# Patient Record
Sex: Female | Born: 1990 | Race: Black or African American | Hispanic: No | Marital: Single | Smoking: Never smoker
Health system: Southern US, Community
[De-identification: ages and names within clinical notes are randomized; demographics above are authoritative.]

## PROBLEM LIST (undated history)

## (undated) ENCOUNTER — Inpatient Hospital Stay: Payer: Self-pay

## (undated) DIAGNOSIS — Z789 Other specified health status: Secondary | ICD-10-CM

## (undated) DIAGNOSIS — K219 Gastro-esophageal reflux disease without esophagitis: Secondary | ICD-10-CM

## (undated) HISTORY — PX: DILATION AND CURETTAGE OF UTERUS: SHX78

---
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
## 2012-07-16 ENCOUNTER — Emergency Department: Payer: Self-pay | Admitting: Emergency Medicine

## 2012-11-16 ENCOUNTER — Ambulatory Visit: Payer: Self-pay | Admitting: Family Medicine

## 2012-11-16 LAB — COMPREHENSIVE METABOLIC PANEL
Albumin: 4.1 g/dL (ref 3.8–5.6)
Alkaline Phosphatase: 57 U/L — ABNORMAL LOW (ref 82–169)
Anion Gap: 7 (ref 7–16)
BUN: 12 mg/dL (ref 7–18)
Bilirubin,Total: 0.2 mg/dL (ref 0.2–1.0)
Calcium, Total: 8.6 mg/dL — ABNORMAL LOW (ref 9.0–10.7)
Chloride: 104 mmol/L (ref 98–107)
Co2: 28 mmol/L (ref 21–32)
Creatinine: 0.81 mg/dL (ref 0.60–1.30)
EGFR (African American): >60
EGFR (Non-African Amer.): >60
Glucose: 84 mg/dL (ref 65–99)
Osmolality: 276 (ref 275–301)
Potassium: 4.1 mmol/L (ref 3.5–5.1)
SGOT(AST): 18 U/L (ref 0–26)
SGPT (ALT): 23 U/L (ref 12–78)
Sodium: 139 mmol/L (ref 136–145)
Total Protein: 7.5 g/dL (ref 6.4–8.6)

## 2012-11-16 LAB — CBC WITH DIFFERENTIAL/PLATELET
Basophil #: 0.1 10*3/uL (ref 0.0–0.1)
Basophil %: 1.1 %
Eosinophil #: 0.1 10*3/uL (ref 0.0–0.7)
Eosinophil %: 1.3 %
HCT: 40.4 % (ref 35.0–47.0)
HGB: 12.8 g/dL (ref 12.0–16.0)
Lymphocyte #: 2.2 10*3/uL (ref 1.0–3.6)
Lymphocyte %: 27.8 %
MCH: 26.2 pg (ref 26.0–34.0)
MCHC: 31.6 g/dL — ABNORMAL LOW (ref 32.0–36.0)
MCV: 83 fL (ref 80–100)
Monocyte #: 0.8 x10 3/mm (ref 0.2–0.9)
Monocyte %: 10.4 %
Neutrophil #: 4.6 10*3/uL (ref 1.4–6.5)
Neutrophil %: 59.4 %
Platelet: 324 10*3/uL (ref 150–440)
RBC: 4.88 10*6/uL (ref 3.80–5.20)
RDW: 14.2 % (ref 11.5–14.5)
WBC: 7.8 10*3/uL (ref 3.6–11.0)

## 2012-11-16 LAB — URINALYSIS, COMPLETE
Bacteria: NEGATIVE
Bilirubin,UR: NEGATIVE
Glucose,UR: NEGATIVE mg/dL (ref 0–75)
Ketone: NEGATIVE
Leukocyte Esterase: NEGATIVE
Nitrite: NEGATIVE
Ph: 7 (ref 4.5–8.0)
Protein: NEGATIVE
Specific Gravity: 1.02 (ref 1.003–1.030)
WBC UR: NONE SEEN /HPF (ref 0–5)

## 2012-11-16 LAB — PREGNANCY, URINE: Pregnancy Test, Urine: NEGATIVE m[IU]/mL

## 2013-07-08 ENCOUNTER — Ambulatory Visit: Payer: Self-pay | Admitting: Physician Assistant

## 2014-09-30 ENCOUNTER — Ambulatory Visit
Admit: 2014-09-30 | Disposition: A | Discharge status: Home / Self Care | Payer: Self-pay | Attending: Family Medicine | Admitting: Family Medicine

## 2014-10-02 ENCOUNTER — Ambulatory Visit
Admit: 2014-10-02 | Disposition: A | Discharge status: Home / Self Care | Payer: Self-pay | Attending: Family Medicine | Admitting: Family Medicine

## 2017-11-27 ENCOUNTER — Observation Stay
Admission: EM | Admit: 2017-11-27 | Discharge: 2017-11-27 | Disposition: A | Discharge status: Home / Self Care | Payer: Medicaid - Out of State | Attending: Obstetrics and Gynecology | Admitting: Obstetrics and Gynecology

## 2017-11-27 ENCOUNTER — Other Ambulatory Visit: Payer: Self-pay

## 2017-11-27 DIAGNOSIS — Z3A29 29 weeks gestation of pregnancy: Secondary | ICD-10-CM | POA: Diagnosis not present

## 2017-11-27 DIAGNOSIS — O2343 Unspecified infection of urinary tract in pregnancy, third trimester: Secondary | ICD-10-CM | POA: Diagnosis not present

## 2017-11-27 HISTORY — DX: Other specified health status: Z78.9

## 2017-11-27 LAB — URINALYSIS, COMPLETE (UACMP) WITH MICROSCOPIC
Bilirubin Urine: NEGATIVE
Glucose, UA: NEGATIVE mg/dL
Hgb urine dipstick: NEGATIVE
Ketones, ur: 20 mg/dL — AB
Nitrite: POSITIVE — AB
Protein, ur: 30 mg/dL — AB
Specific Gravity, Urine: 1.02 (ref 1.005–1.030)
WBC, UA: >50 WBC/hpf — ABNORMAL HIGH (ref 0–5)
pH: 5 (ref 5.0–8.0)

## 2017-11-27 MED ORDER — NITROFURANTOIN MONOHYD MACRO 100 MG PO CAPS: 100.0000 mg | ORAL_CAPSULE | Freq: Two times a day (BID) | ORAL | 0 refills | Status: DC

## 2017-11-27 MED ORDER — NITROFURANTOIN MONOHYD MACRO 100 MG PO CAPS
100.0000 mg | ORAL_CAPSULE | Freq: Two times a day (BID) | ORAL | Status: DC
  Administered 2017-11-27: 100 mg via ORAL
  Filled 2017-11-27: qty 1

## 2017-11-27 NOTE — OB Triage Note (Signed)
Patient presented to L&D with complaints of right sided abdominal pain and right sided back pain since Saturday.  Denies leaking of fluid, vaginal bleeding or decreased fetal movement.  States she noticed her urine had a foul smell starting Saturday, denies history of UTI's or kidney stones.  Patient states she just moved here from [place] and is waiting on her medical records to arrive at [facility] so she can resume her prenatal care.

## 2017-11-27 NOTE — Discharge Summary (Addendum)
    L&D OB Triage Note  SUBJECTIVE Cindy Gibson is a 27 y.o. G2P0010 female at [redacted]w[redacted]d, EDD Estimated Date of Delivery: 02/10/18 who presented to triage with complaints of abdominal pain.   OB History  Gravida Para Term Preterm AB Living  2 0 0 0 1 0  SAB TAB Ectopic Multiple Live Births  1 0 0 0 0    # Outcome Date GA Lbr Len/2nd Weight Sex Delivery Anes PTL Lv  2 Current           1 SAB  [redacted]w[redacted]d           Medications Prior to Admission  Medication Sig Dispense Refill Last Dose  . ondansetron (ZOFRAN) 4 MG tablet Take 4 mg by mouth every 8 (eight) hours as needed for nausea or vomiting.     . Prenatal Vit-Fe Fumarate-FA (MULTIVITAMIN-PRENATAL) 27-0.8 MG TABS tablet Take 1 tablet by mouth daily at 12 noon.       Pt was personally seen by me.   OBJECTIVE  Nursing Evaluation:   BP 115/72 (BP Location: Left Arm)   Pulse (!) 102   Temp 97.9 F (36.6 C) (Oral)   Resp 14   Ht  (1.575 m)   Wt 160 lb (72.6 kg)   BMI 29.26 kg/m    Findings:   U/A  C/w UTI  NST was performed and has been reviewed by me.  NST INTERPRETATION: Category I  Mode: External Baseline Rate (A): 135 bpm Variability: Moderate Accelerations: 15 x 15 Decelerations: None     Contraction Frequency (min): none  ASSESSMENT Impression:  1.  Pregnancy:  G2P0010 at [redacted]w[redacted]d , EDD Estimated Date of Delivery: 02/10/18 2.  NST:  Category I 3.  UTI PLAN 1. Reassurance given 2. Discharge home with standard labor precautions given to return to L&D or call the office for problems. 3. Continue routine prenatal care. 4.  MacroBID now and BID X 7 days 5.  PO hydration advised.

## 2017-11-28 ENCOUNTER — Telehealth: Payer: Self-pay | Admitting: Obstetrics & Gynecology

## 2017-11-28 NOTE — Telephone Encounter (Signed)
We have received records for transfer OB care. Spoke with patient to schedule. Pt wishes to call back to be schedule. ** holding records at my desk until I see patient schedule

## 2017-11-29 ENCOUNTER — Encounter: Payer: Self-pay | Admitting: Obstetrics and Gynecology

## 2017-11-29 ENCOUNTER — Other Ambulatory Visit: Payer: Self-pay | Admitting: Obstetrics and Gynecology

## 2017-11-29 ENCOUNTER — Ambulatory Visit (INDEPENDENT_AMBULATORY_CARE_PROVIDER_SITE_OTHER): Payer: PRIVATE HEALTH INSURANCE | Admitting: Obstetrics and Gynecology

## 2017-11-29 VITALS — BP 108/72 | HR 109 | Wt 163.5 lb

## 2017-11-29 DIAGNOSIS — Z3483 Encounter for supervision of other normal pregnancy, third trimester: Secondary | ICD-10-CM | POA: Diagnosis not present

## 2017-11-29 DIAGNOSIS — O26843 Uterine size-date discrepancy, third trimester: Secondary | ICD-10-CM

## 2017-11-29 DIAGNOSIS — Z3A29 29 weeks gestation of pregnancy: Secondary | ICD-10-CM

## 2017-11-29 DIAGNOSIS — Z3689 Encounter for other specified antenatal screening: Secondary | ICD-10-CM

## 2017-11-29 DIAGNOSIS — Z23 Encounter for immunization: Secondary | ICD-10-CM

## 2017-11-29 LAB — POCT URINALYSIS DIPSTICK
Bilirubin, UA: NEGATIVE
Clarity, UA: NEGATIVE
Glucose, UA: NEGATIVE
Ketones, UA: NEGATIVE
Nitrite, UA: NEGATIVE
Odor: NEGATIVE
Protein, UA: POSITIVE — AB
Spec Grav, UA: 1.015 (ref 1.010–1.025)
Urobilinogen, UA: 0.2 E.U./dL
pH, UA: 6.5 (ref 5.0–8.0)

## 2017-11-29 MED ORDER — TETANUS-DIPHTH-ACELL PERTUSSIS 5-2.5-18.5 LF-MCG/0.5 IM SUSP
0.5000 mL | Freq: Once | INTRAMUSCULAR | Status: AC
  Administered 2017-11-29: 0.5 mL via INTRAMUSCULAR

## 2017-11-29 NOTE — Progress Notes (Signed)
NOB- transfer from [facility]. Records to be faxed over. TDap and BTC done today.

## 2017-11-29 NOTE — Telephone Encounter (Signed)
Patient is schedule 12/04/17 with JC

## 2017-11-29 NOTE — Progress Notes (Signed)
ROB: Patient presents as a transfer from [place] and [redacted] weeks gestation.  She was seen in triage a few days ago for UTI and is currently taking Macrobid as prescribed.  Also taking prenatal vitamins.  Size less than dates-ultrasound ordered.  (Patient describes a history of hospitalization for hyperemesis early pregnancy) Schedule hour GCT

## 2017-11-30 ENCOUNTER — Observation Stay
Admission: EM | Admit: 2017-11-30 | Discharge: 2017-11-30 | Disposition: A | Discharge status: Home / Self Care | Payer: Medicaid - Out of State | Attending: Obstetrics and Gynecology | Admitting: Obstetrics and Gynecology

## 2017-11-30 ENCOUNTER — Other Ambulatory Visit: Payer: Self-pay

## 2017-11-30 DIAGNOSIS — O26893 Other specified pregnancy related conditions, third trimester: Secondary | ICD-10-CM | POA: Diagnosis not present

## 2017-11-30 DIAGNOSIS — O212 Late vomiting of pregnancy: Principal | ICD-10-CM | POA: Insufficient documentation

## 2017-11-30 DIAGNOSIS — Z3490 Encounter for supervision of normal pregnancy, unspecified, unspecified trimester: Secondary | ICD-10-CM

## 2017-11-30 DIAGNOSIS — Z3A30 30 weeks gestation of pregnancy: Secondary | ICD-10-CM | POA: Diagnosis not present

## 2017-11-30 LAB — CULTURE, OB URINE: Culture: >=100000 — AB

## 2017-11-30 MED ORDER — SODIUM CHLORIDE 0.45 % IV SOLN
Freq: Once | INTRAVENOUS | Status: AC
  Administered 2017-11-30: 1000 mL via INTRAVENOUS

## 2017-11-30 MED ORDER — ONDANSETRON HCL 4 MG/2ML IJ SOLN
4.0000 mg | Freq: Once | INTRAMUSCULAR | Status: AC
  Administered 2017-11-30: 4 mg via INTRAVENOUS
  Filled 2017-11-30: qty 2

## 2017-11-30 NOTE — OB Triage Note (Signed)
Discharge instructions provided and reviewed.  Follow up care discussed.  Pt verbalized understanding. 

## 2017-11-30 NOTE — OB Triage Note (Signed)
Pt arrival to triage with c/o vomiting since Tuesday, after being diagnosed with a UTI.  Pt denies vaginal bleeding, LOF, and contractions.  States she is feeling baby move normally.  EFM and toco applied and assessing.

## 2017-12-03 ENCOUNTER — Other Ambulatory Visit: Payer: PRIVATE HEALTH INSURANCE

## 2017-12-03 ENCOUNTER — Telehealth: Payer: Self-pay | Admitting: Obstetrics and Gynecology

## 2017-12-03 NOTE — Telephone Encounter (Signed)
The patient would like to speak with a nurse in regards to her throwing up and not being able to keep anything down, no other information was disclosed. Please advise.

## 2017-12-03 NOTE — Discharge Summary (Signed)
    L&D OB Triage Note  SUBJECTIVE Cindy Gibson is a 27 y.o. G2P0010 female at 3818w1d, EDD Estimated Date of Delivery: 02/10/18 who presented to triage with complaints of N/V.   She is taking her MAcroBID  OB History  Gravida Para Term Preterm AB Living  2 0 0 0 1 0  SAB TAB Ectopic Multiple Live Births  1 0 0 0 0    # Outcome Date GA Lbr Len/2nd Weight Sex Delivery Anes PTL Lv  2 Current           1 SAB  7453w0d           No medications prior to admission.     OBJECTIVE  Nursing Evaluation:   BP 135/71 (BP Location: Left Arm)   Pulse (!) 109   Temp 98.4 F (36.9 C) (Oral)   Resp 16   Ht 5\' 1"  (1.549 m)   Wt 163 lb (73.9 kg)   BMI 30.80 kg/m    Findings:   Pt mildly dehydrated from N/V  NST was performed and has been reviewed by me.  NST INTERPRETATION: Category I  Mode: External(monitors d/c'd for discharge per MD Keyra Virella) Baseline Rate (A): 140 bpm(fht) Variability: Moderate Accelerations: 15 x 15 Decelerations: Variable     Contraction Frequency (min): slight uterine irritability  ASSESSMENT Impression:  1.  Pregnancy:  G2P0010 at 2518w1d , EDD Estimated Date of Delivery: 02/10/18 2.  NST:  Category I  3.  N/V - resolved with IV hydration and zofran.  PLAN 1. Reassurance given 2. Discharge home with standard labor precautions given to return to L&D or call the office for problems. 3. Continue routine prenatal care.

## 2017-12-03 NOTE — Telephone Encounter (Signed)
lmtrc

## 2017-12-04 ENCOUNTER — Encounter: Payer: Self-pay | Admitting: Maternal Newborn

## 2017-12-04 NOTE — Telephone Encounter (Signed)
Pt states she was seen at [facility] for n/v. Given phenergan. Feeling much better. Request to r/s lab appt. Done.

## 2017-12-06 ENCOUNTER — Other Ambulatory Visit: Payer: PRIVATE HEALTH INSURANCE

## 2017-12-13 ENCOUNTER — Ambulatory Visit (INDEPENDENT_AMBULATORY_CARE_PROVIDER_SITE_OTHER): Payer: PRIVATE HEALTH INSURANCE

## 2017-12-13 ENCOUNTER — Other Ambulatory Visit: Payer: PRIVATE HEALTH INSURANCE

## 2017-12-13 ENCOUNTER — Ambulatory Visit (INDEPENDENT_AMBULATORY_CARE_PROVIDER_SITE_OTHER): Payer: PRIVATE HEALTH INSURANCE | Admitting: Obstetrics and Gynecology

## 2017-12-13 VITALS — BP 112/69 | HR 91 | Wt 162.9 lb

## 2017-12-13 DIAGNOSIS — O26843 Uterine size-date discrepancy, third trimester: Secondary | ICD-10-CM | POA: Diagnosis not present

## 2017-12-13 DIAGNOSIS — Z3403 Encounter for supervision of normal first pregnancy, third trimester: Secondary | ICD-10-CM | POA: Diagnosis not present

## 2017-12-13 DIAGNOSIS — Z3A31 31 weeks gestation of pregnancy: Secondary | ICD-10-CM

## 2017-12-13 DIAGNOSIS — Z3689 Encounter for other specified antenatal screening: Secondary | ICD-10-CM

## 2017-12-13 LAB — POCT URINALYSIS DIPSTICK
Bilirubin, UA: NEGATIVE
Blood, UA: NEGATIVE
Glucose, UA: NEGATIVE
Ketones, UA: NEGATIVE
Leukocytes, UA: NEGATIVE
Nitrite, UA: NEGATIVE
Protein, UA: POSITIVE — AB
Spec Grav, UA: 1.01 (ref 1.010–1.025)
Urobilinogen, UA: 0.2 E.U./dL
pH, UA: 8.5 — AB (ref 5.0–8.0)

## 2017-12-13 MED ORDER — PANTOPRAZOLE SODIUM 20 MG PO TBEC: 20.0000 mg | DELAYED_RELEASE_TABLET | Freq: Two times a day (BID) | ORAL | 2 refills | Status: DC

## 2017-12-13 NOTE — Progress Notes (Signed)
ROB: Patient with complaints of heartburn, not relieved by Zantac. Prescribed Protonix.  Normal growth scan today (performed for size/dates discrepancy, growth in 34%ile). Given information regarding Pediatricians in the area. 28 week labs done today. RTC in 2 weeks.

## 2017-12-13 NOTE — Progress Notes (Signed)
ROB-pt is doing well having heartburn. No other concerns.

## 2017-12-13 NOTE — Patient Instructions (Signed)
[facility]   [facility]  [address], [place], [place] [address]  Phone: [phone]   [facility] (second location)  [address] [place], [place] [address]  Phone: [phone]   [facility] ([place]) [address], [place], [place] [address] Phone: [phone]   [facility]  [address] , [place] [address]  Phone: [phone]      Third Trimester of Pregnancy The third trimester is from week 29 through week 42, months 7 through 9. This trimester is when your unborn baby (fetus) is growing very fast. At the end of the ninth month, the unborn baby is about 20 inches in length. It weighs about 6-10 pounds. Follow these instructions at home:  Avoid all smoking, herbs, and alcohol. Avoid drugs not approved by your doctor.  Do not use any tobacco products, including cigarettes, chewing tobacco, and electronic cigarettes. If you need help quitting, ask your doctor. You may get counseling or other support to help you quit.  Only take medicine as told by your doctor. Some medicines are safe and some are not during pregnancy.  Exercise only as told by your doctor. Stop exercising if you start having cramps.  Eat regular, healthy meals.  Wear a good support bra if your breasts are tender.  Do not use hot tubs, steam rooms, or saunas.  Wear your seat belt when driving.  Avoid raw meat, uncooked cheese, and liter boxes and soil used by cats.  Take your prenatal vitamins.  Take 1500-2000 milligrams of calcium daily starting at the 20th week of pregnancy until you deliver your baby.  Try taking medicine that helps you poop (stool softener) as needed, and if your doctor approves. Eat more fiber by eating fresh fruit, vegetables, and whole grains. Drink enough fluids to keep your pee (urine) clear or pale yellow.  Take warm water baths (sitz baths) to soothe pain or discomfort caused by  hemorrhoids. Use hemorrhoid cream if your doctor approves.  If you have puffy, bulging veins (varicose veins), wear support hose. Raise (elevate) your feet for 15 minutes, 3-4 times a day. Limit salt in your diet.  Avoid heavy lifting, wear low heels, and sit up straight.  Rest with your legs raised if you have leg cramps or low back pain.  Visit your dentist if you have not gone during your pregnancy. Use a soft toothbrush to brush your teeth. Be gentle when you floss.  You can have sex (intercourse) unless your doctor tells you not to.  Do not travel far distances unless you must. Only do so with your doctor's approval.  Take prenatal classes.  Practice driving to the hospital.  Pack your hospital bag.  Prepare the baby's room.  Go to your doctor visits. Get help if:  You are not sure if you are in labor or if your water has broken.  You are dizzy.  You have mild cramps or pressure in your lower belly (abdominal).  You have a nagging pain in your belly area.  You continue to feel sick to your stomach (nauseous), throw up (vomit), or have watery poop (diarrhea).  You have bad smelling fluid coming from your vagina.  You have pain with peeing (urination). Get help right away if:  You have a fever.  You are leaking fluid from your vagina.  You are spotting or bleeding from your vagina.  You have severe belly cramping or pain.  You lose or gain weight   rapidly.  You have trouble catching your breath and have chest pain.  You notice sudden or extreme puffiness (swelling) of your face, hands, ankles, feet, or legs.  You have not felt the baby move in over an hour.  You have severe headaches that do not go away with medicine.  You have vision changes. This information is not intended to replace advice given to you by your health care provider. Make sure you discuss any questions you have with your health care provider. Document Released: 09/13/2009 Document  Revised: 11/25/2015 Document Reviewed: 08/20/2012 Elsevier Interactive Patient Education  2017 Elsevier Inc.  

## 2017-12-14 LAB — CBC
Hematocrit: 36.3 % (ref 34.0–46.6)
Hemoglobin: 11.9 g/dL (ref 11.1–15.9)
MCH: 26.1 pg — ABNORMAL LOW (ref 26.6–33.0)
MCHC: 32.8 g/dL (ref 31.5–35.7)
MCV: 80 fL (ref 79–97)
Platelets: 481 10*3/uL — ABNORMAL HIGH (ref 150–450)
RBC: 4.56 x10E6/uL (ref 3.77–5.28)
RDW: 14.7 % (ref 12.3–15.4)
WBC: 9.8 10*3/uL (ref 3.4–10.8)

## 2017-12-14 LAB — GLUCOSE, 1 HOUR GESTATIONAL: Gestational Diabetes Screen: 119 mg/dL (ref 65–139)

## 2017-12-14 LAB — RPR: RPR Ser Ql: NONREACTIVE

## 2017-12-17 ENCOUNTER — Telehealth: Payer: Self-pay | Admitting: General Practice

## 2017-12-17 NOTE — Telephone Encounter (Signed)
Pt called back and went over test results.

## 2017-12-17 NOTE — Telephone Encounter (Signed)
Patient returning nurse call about labs, please contact patient on cell at [phone] and voicemail is active per patient, please advise, thanks.

## 2017-12-27 ENCOUNTER — Encounter: Payer: Self-pay | Admitting: Obstetrics and Gynecology

## 2017-12-27 ENCOUNTER — Ambulatory Visit (INDEPENDENT_AMBULATORY_CARE_PROVIDER_SITE_OTHER): Payer: PRIVATE HEALTH INSURANCE | Admitting: Obstetrics and Gynecology

## 2017-12-27 VITALS — BP 112/73 | HR 82 | Wt 164.3 lb

## 2017-12-27 DIAGNOSIS — Z3A33 33 weeks gestation of pregnancy: Secondary | ICD-10-CM

## 2017-12-27 DIAGNOSIS — Z3483 Encounter for supervision of other normal pregnancy, third trimester: Secondary | ICD-10-CM

## 2017-12-27 LAB — POCT URINALYSIS DIPSTICK
Bilirubin, UA: NEGATIVE
Blood, UA: NEGATIVE
Glucose, UA: NEGATIVE
Ketones, UA: NEGATIVE
Nitrite, UA: NEGATIVE
Protein, UA: NEGATIVE
Spec Grav, UA: <=1.005 — AB (ref 1.010–1.025)
Urobilinogen, UA: 0.2 E.U./dL
pH, UA: 7.5 (ref 5.0–8.0)

## 2017-12-27 NOTE — Progress Notes (Signed)
ROB: No complaints.  Signs and symptoms of labor discussed.  Cultures next visit.

## 2017-12-27 NOTE — Progress Notes (Signed)
ROB-Pt stated that she is doing well no complaints.  

## 2018-01-09 ENCOUNTER — Other Ambulatory Visit (HOSPITAL_COMMUNITY)
Admission: RE | Admit: 2018-01-09 | Discharge: 2018-01-09 | Disposition: A | Discharge status: Home / Self Care | Payer: Medicaid Other | Source: Ambulatory Visit | Attending: Obstetrics and Gynecology | Admitting: Obstetrics and Gynecology

## 2018-01-09 ENCOUNTER — Ambulatory Visit (INDEPENDENT_AMBULATORY_CARE_PROVIDER_SITE_OTHER): Payer: Medicaid Other | Admitting: Obstetrics and Gynecology

## 2018-01-09 VITALS — BP 111/70 | HR 96 | Wt 166.7 lb

## 2018-01-09 DIAGNOSIS — Z113 Encounter for screening for infections with a predominantly sexual mode of transmission: Secondary | ICD-10-CM

## 2018-01-09 DIAGNOSIS — Z3A34 34 weeks gestation of pregnancy: Secondary | ICD-10-CM | POA: Diagnosis not present

## 2018-01-09 DIAGNOSIS — Z3483 Encounter for supervision of other normal pregnancy, third trimester: Secondary | ICD-10-CM | POA: Diagnosis present

## 2018-01-09 DIAGNOSIS — O9989 Other specified diseases and conditions complicating pregnancy, childbirth and the puerperium: Secondary | ICD-10-CM | POA: Diagnosis not present

## 2018-01-09 DIAGNOSIS — N898 Other specified noninflammatory disorders of vagina: Secondary | ICD-10-CM | POA: Diagnosis not present

## 2018-01-09 DIAGNOSIS — Z3685 Encounter for antenatal screening for Streptococcus B: Secondary | ICD-10-CM

## 2018-01-09 LAB — POCT URINALYSIS DIPSTICK
Bilirubin, UA: NEGATIVE
Blood, UA: NEGATIVE
Glucose, UA: NEGATIVE
Ketones, UA: NEGATIVE
Leukocytes, UA: NEGATIVE
Nitrite, UA: NEGATIVE
Protein, UA: POSITIVE — AB
Spec Grav, UA: <=1.005 — AB (ref 1.010–1.025)
Urobilinogen, UA: 0.2 E.U./dL
pH, UA: 7.5 (ref 5.0–8.0)

## 2018-01-09 NOTE — Progress Notes (Signed)
ROB: C/o vaginal swelling and discharge x 3 days. Wet prep with very few clue cells, otherwise negative. Advised on Vagisil for symptoms. 35 week cultures done today. RTC in 1 week

## 2018-01-09 NOTE — Progress Notes (Signed)
Cindy Gibson stated that her vaginal area is swollen and she has discharge x 3 days.

## 2018-01-09 NOTE — Addendum Note (Signed)
Addended by: Silvano BilisHAMPTON, Kelden Lavallee L on: 01/09/2018 04:39 PM   Modules accepted: Orders

## 2018-01-11 LAB — GC/CHLAMYDIA PROBE AMP (~~LOC~~) NOT AT ARMC
Chlamydia: NEGATIVE
Neisseria Gonorrhea: NEGATIVE

## 2018-01-11 LAB — STREP GP B NAA: Strep Gp B NAA: POSITIVE — AB

## 2018-01-13 ENCOUNTER — Observation Stay
Admission: EM | Admit: 2018-01-13 | Discharge: 2018-01-13 | Disposition: A | Discharge status: Home / Self Care | Payer: Medicaid Other | Attending: Obstetrics and Gynecology | Admitting: Obstetrics and Gynecology

## 2018-01-13 ENCOUNTER — Other Ambulatory Visit: Payer: Self-pay

## 2018-01-13 DIAGNOSIS — O210 Mild hyperemesis gravidarum: Secondary | ICD-10-CM | POA: Diagnosis not present

## 2018-01-13 DIAGNOSIS — Z3A36 36 weeks gestation of pregnancy: Secondary | ICD-10-CM

## 2018-01-13 DIAGNOSIS — O212 Late vomiting of pregnancy: Principal | ICD-10-CM | POA: Insufficient documentation

## 2018-01-13 LAB — URINALYSIS, ROUTINE W REFLEX MICROSCOPIC
Bilirubin Urine: NEGATIVE
Glucose, UA: NEGATIVE mg/dL
Hgb urine dipstick: NEGATIVE
Ketones, ur: 80 mg/dL — AB
Leukocytes, UA: NEGATIVE
Nitrite: NEGATIVE
Protein, ur: 100 mg/dL — AB
Specific Gravity, Urine: 1.025 (ref 1.005–1.030)
pH: 8 (ref 5.0–8.0)

## 2018-01-13 MED ORDER — PROMETHAZINE HCL 25 MG RE SUPP: 25.0000 mg | Freq: Four times a day (QID) | RECTAL | 0 refills | Status: DC | PRN

## 2018-01-13 MED ORDER — LACTATED RINGERS IV SOLN
INTRAVENOUS | Status: DC
  Administered 2018-01-13: 20:00:00 via INTRAVENOUS

## 2018-01-13 MED ORDER — LACTATED RINGERS IV BOLUS
1000.0000 mL | Freq: Once | INTRAVENOUS | Status: AC
  Administered 2018-01-13: 1000 mL via INTRAVENOUS

## 2018-01-13 MED ORDER — ONDANSETRON HCL 4 MG/2ML IJ SOLN
4.0000 mg | Freq: Once | INTRAMUSCULAR | Status: AC
  Administered 2018-01-13: 4 mg via INTRAVENOUS

## 2018-01-13 MED ORDER — ONDANSETRON HCL 4 MG/2ML IJ SOLN
INTRAMUSCULAR | Status: AC
  Administered 2018-01-13: 4 mg via INTRAVENOUS
  Filled 2018-01-13: qty 2

## 2018-01-13 MED ORDER — METOCLOPRAMIDE HCL 5 MG/ML IJ SOLN
10.0000 mg | Freq: Once | INTRAMUSCULAR | Status: AC
  Administered 2018-01-13: 10 mg via INTRAVENOUS
  Filled 2018-01-13: qty 2

## 2018-01-13 NOTE — Discharge Summary (Signed)
    L&D OB Triage Note  SUBJECTIVE Lucina Karlyn AgeeSheria Brusca is a 27 y.o. G2P0010 female at 5659w0d, EDD Estimated Date of Delivery: 02/10/18 who presented to triage with complaints of vomitting.   OB History  Gravida Para Term Preterm AB Living  2 0 0 0 1 0  SAB TAB Ectopic Multiple Live Births  1 0 0 0 0    # Outcome Date GA Lbr Len/2nd Weight Sex Delivery Anes PTL Lv  2 Current           1 SAB  735w0d           Medications Prior to Admission  Medication Sig Dispense Refill Last Dose  . Prenatal Vit-Fe Fumarate-FA (MULTIVITAMIN-PRENATAL) 27-0.8 MG TABS tablet Take 1 tablet by mouth daily at 12 noon.   01/13/2018 at Unknown time  . promethazine (PHENERGAN) 25 MG tablet Take 25 mg by mouth every 8 (eight) hours as needed for nausea or vomiting.   01/13/2018 at 0900  . ondansetron (ZOFRAN) 4 MG tablet Take 4 mg by mouth every 8 (eight) hours as needed for nausea or vomiting.   Completed Course at Unknown time  . pantoprazole (PROTONIX) 20 MG tablet Take 1 tablet (20 mg total) by mouth 2 (two) times daily. (Patient not taking: Reported on 01/13/2018) 60 tablet 2 Not Taking at Unknown time     OBJECTIVE  Nursing Evaluation:   BP 135/81 (BP Location: Left Arm)   Pulse (!) 116   Resp 16    Findings:   No contractions     No better after IV hydration and zofran.  Reglan and pt felt better.   NST was performed and has been reviewed by me.  NST INTERPRETATION: Category I  Mode: External Baseline Rate (A): 125 bpm Variability: Moderate Accelerations: 15 x 15 Decelerations: None     Contraction Frequency (min): UI  ASSESSMENT Impression:  1.  Pregnancy:  G2P0010 at 5859w0d , EDD Estimated Date of Delivery: 02/10/18 2.  NST:  Category I  PLAN 1. Reassurance given 2. Discharge home with standard labor precautions given to return to L&D or call the office for problems. 3. Continue routine prenatal care.

## 2018-01-13 NOTE — OB Triage Note (Signed)
G2/P0, 7357w0d here with constant vomiting since last night. Reports many episodes of vomiting. Has not ate solids since last night. Attempted liquids today, but vomited each time. Also reporting contractions, unknown timing regarding when they occur, occasional. Complains of lower abd pain, soreness 7/10 due to vomiting, 7/10 cramping when contractions occur. Denies vaginal bleeding, leaking of fluid. Positive fetal movement reported. Monitors applied/assessing.

## 2018-01-14 ENCOUNTER — Telehealth: Payer: Self-pay | Admitting: Obstetrics and Gynecology

## 2018-01-14 NOTE — Telephone Encounter (Signed)
The patient called and stated that's she would like to speak with a nurse in regards to her medication. Please advise.

## 2018-01-15 NOTE — Telephone Encounter (Signed)
Pt was called no answer LM via voicemail to call the office.  

## 2018-01-16 ENCOUNTER — Inpatient Hospital Stay: Payer: Medicaid Other

## 2018-01-16 ENCOUNTER — Encounter: Payer: PRIVATE HEALTH INSURANCE | Admitting: Obstetrics and Gynecology

## 2018-01-16 ENCOUNTER — Inpatient Hospital Stay
Admission: EM | Admit: 2018-01-16 | Discharge: 2018-01-20 | DRG: 786 | Disposition: A | Discharge status: Home / Self Care | Payer: Medicaid Other | Attending: Obstetrics and Gynecology | Admitting: Obstetrics and Gynecology

## 2018-01-16 ENCOUNTER — Other Ambulatory Visit: Payer: Self-pay

## 2018-01-16 DIAGNOSIS — O368130 Decreased fetal movements, third trimester, not applicable or unspecified: Secondary | ICD-10-CM

## 2018-01-16 DIAGNOSIS — O4103X0 Oligohydramnios, third trimester, not applicable or unspecified: Principal | ICD-10-CM | POA: Diagnosis present

## 2018-01-16 DIAGNOSIS — Z98891 History of uterine scar from previous surgery: Secondary | ICD-10-CM

## 2018-01-16 DIAGNOSIS — O165 Unspecified maternal hypertension, complicating the puerperium: Secondary | ICD-10-CM | POA: Diagnosis present

## 2018-01-16 DIAGNOSIS — E876 Hypokalemia: Secondary | ICD-10-CM

## 2018-01-16 DIAGNOSIS — O99824 Streptococcus B carrier state complicating childbirth: Secondary | ICD-10-CM | POA: Diagnosis present

## 2018-01-16 DIAGNOSIS — O9962 Diseases of the digestive system complicating childbirth: Secondary | ICD-10-CM | POA: Diagnosis present

## 2018-01-16 DIAGNOSIS — O365130 Maternal care for known or suspected placental insufficiency, third trimester, not applicable or unspecified: Secondary | ICD-10-CM | POA: Diagnosis present

## 2018-01-16 DIAGNOSIS — K219 Gastro-esophageal reflux disease without esophagitis: Secondary | ICD-10-CM | POA: Diagnosis present

## 2018-01-16 DIAGNOSIS — O99214 Obesity complicating childbirth: Secondary | ICD-10-CM | POA: Diagnosis present

## 2018-01-16 DIAGNOSIS — Z3A35 35 weeks gestation of pregnancy: Secondary | ICD-10-CM

## 2018-01-16 DIAGNOSIS — E669 Obesity, unspecified: Secondary | ICD-10-CM | POA: Diagnosis present

## 2018-01-16 DIAGNOSIS — R0989 Other specified symptoms and signs involving the circulatory and respiratory systems: Secondary | ICD-10-CM

## 2018-01-16 DIAGNOSIS — O365190 Maternal care for known or suspected placental insufficiency, unspecified trimester, not applicable or unspecified: Secondary | ICD-10-CM | POA: Diagnosis present

## 2018-01-16 DIAGNOSIS — Z3490 Encounter for supervision of normal pregnancy, unspecified, unspecified trimester: Secondary | ICD-10-CM

## 2018-01-16 DIAGNOSIS — O99284 Endocrine, nutritional and metabolic diseases complicating childbirth: Secondary | ICD-10-CM | POA: Diagnosis present

## 2018-01-16 HISTORY — DX: Gastro-esophageal reflux disease without esophagitis: K21.9

## 2018-01-16 LAB — CBC WITH DIFFERENTIAL/PLATELET
Basophils Absolute: 0.1 10*3/uL (ref 0–0.1)
Basophils Relative: 1 %
Eosinophils Absolute: 0 10*3/uL (ref 0–0.7)
Eosinophils Relative: 0 %
HCT: 40.6 % (ref 35.0–47.0)
Hemoglobin: 13.6 g/dL (ref 12.0–16.0)
Lymphocytes Relative: 25 %
Lymphs Abs: 2.7 10*3/uL (ref 1.0–3.6)
MCH: 26.3 pg (ref 26.0–34.0)
MCHC: 33.4 g/dL (ref 32.0–36.0)
MCV: 78.7 fL — ABNORMAL LOW (ref 80.0–100.0)
Monocytes Absolute: 1 10*3/uL — ABNORMAL HIGH (ref 0.2–0.9)
Monocytes Relative: 9 %
Neutro Abs: 6.7 10*3/uL — ABNORMAL HIGH (ref 1.4–6.5)
Neutrophils Relative %: 65 %
Platelets: 439 10*3/uL (ref 150–440)
RBC: 5.16 MIL/uL (ref 3.80–5.20)
RDW: 14.8 % — ABNORMAL HIGH (ref 11.5–14.5)
WBC: 10.5 10*3/uL (ref 3.6–11.0)

## 2018-01-16 LAB — URINALYSIS, ROUTINE W REFLEX MICROSCOPIC
Glucose, UA: NEGATIVE mg/dL
Hgb urine dipstick: NEGATIVE
Ketones, ur: 15 mg/dL — AB
Nitrite: NEGATIVE
Protein, ur: 30 mg/dL — AB
Specific Gravity, Urine: 1.015 (ref 1.005–1.030)
pH: 7 (ref 5.0–8.0)

## 2018-01-16 LAB — URINALYSIS, MICROSCOPIC (REFLEX): Bacteria, UA: NONE SEEN

## 2018-01-16 LAB — URINE DRUG SCREEN, QUALITATIVE (ARMC ONLY)
Amphetamines, Ur Screen: NOT DETECTED
Benzodiazepine, Ur Scrn: NOT DETECTED
Cannabinoid 50 Ng, Ur ~~LOC~~: POSITIVE — AB
Cocaine Metabolite,Ur ~~LOC~~: NOT DETECTED
MDMA (Ecstasy)Ur Screen: NOT DETECTED
Methadone Scn, Ur: NOT DETECTED
Opiate, Ur Screen: NOT DETECTED
Phencyclidine (PCP) Ur S: NOT DETECTED
Tricyclic, Ur Screen: NOT DETECTED

## 2018-01-16 LAB — TYPE AND SCREEN
ABO/RH(D): O POS
Antibody Screen: NEGATIVE

## 2018-01-16 MED ORDER — ALUM & MAG HYDROXIDE-SIMETH 200-200-20 MG/5ML PO SUSP
30.0000 mL | ORAL | Status: DC | PRN
  Administered 2018-01-16 – 2018-01-17 (×2): 30 mL via ORAL
  Filled 2018-01-16: qty 30

## 2018-01-16 MED ORDER — AMMONIA AROMATIC IN INHA
RESPIRATORY_TRACT | Status: AC
  Filled 2018-01-16: qty 10

## 2018-01-16 MED ORDER — METOCLOPRAMIDE HCL 5 MG/ML IJ SOLN
10.0000 mg | Freq: Four times a day (QID) | INTRAMUSCULAR | Status: DC
  Administered 2018-01-16 – 2018-01-18 (×6): 10 mg via INTRAVENOUS
  Filled 2018-01-16 (×7): qty 2

## 2018-01-16 MED ORDER — MISOPROSTOL 200 MCG PO TABS
ORAL_TABLET | ORAL | Status: AC
  Filled 2018-01-16: qty 4

## 2018-01-16 MED ORDER — LIDOCAINE HCL (PF) 1 % IJ SOLN
INTRAMUSCULAR | Status: AC
  Filled 2018-01-16: qty 30

## 2018-01-16 MED ORDER — ZOLPIDEM TARTRATE 5 MG PO TABS
5.0000 mg | ORAL_TABLET | Freq: Every evening | ORAL | Status: DC | PRN
Start: 2018-01-16 — End: 2018-01-20
  Administered 2018-01-16: 5 mg via ORAL
  Filled 2018-01-16: qty 1

## 2018-01-16 MED ORDER — LACTATED RINGERS IV BOLUS
1000.0000 mL | Freq: Once | INTRAVENOUS | Status: AC
  Administered 2018-01-16: 1000 mL via INTRAVENOUS

## 2018-01-16 MED ORDER — LACTATED RINGERS IV SOLN
INTRAVENOUS | Status: DC
  Administered 2018-01-16 – 2018-01-17 (×4): via INTRAVENOUS

## 2018-01-16 MED ORDER — OXYTOCIN 40 UNITS IN LACTATED RINGERS INFUSION - SIMPLE MED
INTRAVENOUS | Status: AC
  Filled 2018-01-16: qty 1000

## 2018-01-16 MED ORDER — OXYTOCIN 10 UNIT/ML IJ SOLN
INTRAMUSCULAR | Status: AC
  Filled 2018-01-16: qty 2

## 2018-01-16 MED ORDER — ALUM & MAG HYDROXIDE-SIMETH 200-200-20 MG/5ML PO SUSP
ORAL | Status: AC
  Filled 2018-01-16: qty 30

## 2018-01-16 NOTE — OB Triage Note (Signed)
Pt is a G2P0 at 8172w3d that presents from ED c/o N/V since beginning of pregnancy. Pt states she has thrown up 10 times in the last 24 hours and took suppository phenergan around 2200 on 01/15/18 with no relief. Pt states "I can't keep any food or liquid down." Pt denies Headache, LOF, VB, and states Positive FM. Initial FHT 145 monitors applied and assessing.

## 2018-01-16 NOTE — Progress Notes (Signed)
Patient ID: Cindy Gibson, female   DOB: Oct 07, 1990, 27 y.o.   MRN: [id number]    L&D OB Triage Note  SUBJECTIVE Cindy Gibson is a 27 y.o. G2P0010 female at 2478w3d, EDD Estimated Date of Delivery: 02/10/18 who presented to triage with complaints of persistent N/V.  She says she has been sick for more than 1 week.  Seen one week ago had ketonuria and received zofran reglan and IV hydration.   N/V has returned.    OB History  Gravida Para Term Preterm AB Living  2 0 0 0 1 0  SAB TAB Ectopic Multiple Live Births  1 0 0 0 0    # Outcome Date GA Lbr Len/2nd Weight Sex Delivery Anes PTL Lv  2 Current           1 SAB  271w0d           Medications Prior to Admission  Medication Sig Dispense Refill Last Dose  . pantoprazole (PROTONIX) 20 MG tablet Take 1 tablet (20 mg total) by mouth 2 (two) times daily. 60 tablet 2 01/15/2018 at Unknown time  . Prenatal Vit-Fe Fumarate-FA (MULTIVITAMIN-PRENATAL) 27-0.8 MG TABS tablet Take 1 tablet by mouth daily at 12 noon.   01/15/2018 at Unknown time  . promethazine (PHENERGAN) 25 MG suppository Place 1 suppository (25 mg total) rectally every 6 (six) hours as needed for nausea. 6 suppository 0 01/15/2018 at Unknown time     OBJECTIVE  Nursing Evaluation:   BP (!) 147/86   Pulse (!) 101   Temp 98.1 F (36.7 C) (Oral)   Resp 16   Ht 5\' 1"  (1.549 m)   Wt 166 lb (75.3 kg)   BMI 31.37 kg/m    Findings:   Upon initial evaluation, pt was noted to have some variable and late decelerations.  IV hydration fluid bolus was performed.  BPP ordered 6/8 with low AFI noted.   Monitor strip became reactive with no further decels.  NEGATIVE spontaneous CST.  BPP 8/10 if you include the NST.  Pt has had NO episodes of N/V all day without antiemetics.   NST was performed and has been reviewed by me. And CST eval on running monitor strip. I personally saw and reveiwed all of this with the patient and family.  NST INTERPRETATION: Category I now with  NEGATIVE CST  Mode: External Baseline Rate (A): 135 bpm Variability: Moderate Accelerations: 15 x 15 Decelerations: None     Contraction Frequency (min): occasional Previous cervical exam unfavorable.   ASSESSMENT Impression:  1.  Pregnancy:  G2P0010 at 7478w3d , EDD Estimated Date of Delivery: 02/10/18 2.  NST:  Category I  3.  Negative CST 4.  Low AFI likely secondary to 1 week of dehydration with N/V. 5.  Preterm with unfavorable cervix.   PLAN 1. IV and PO hydration.   2. Continuous fetal monitoring through night. 3.  If no further issues with N/V or Category 2 or worse EFM - consider d/c to home with kick counts and U/S f/u BPP on Friday AM with special attention to AFI. Would rather delivery baby closer to term if AFI improves with no evidence of fetal compromise.  Discussed all of the above with pt and family.  All questions answered and all agree with plan of care.

## 2018-01-16 NOTE — Progress Notes (Deleted)
Patient ID: Cindy Gibson, female   DOB: 1990-07-04, 27 y.o.   MRN: [id number] LABOR NOTE   Cindy Gibson 27 y.o.@ at 6059w3d Not in labor.  SUBJECTIVE:  Pt a little more uncomfortable with contractions. OBJECTIVE:  BP (!) 147/86   Pulse (!) 101   Temp 98.1 F (36.7 C) (Oral)   Resp 16   Ht 5\' 1"  (1.549 m)   Wt 166 lb (75.3 kg)   BMI 31.37 kg/m  No intake/output data recorded.  She has not shown cervical change. CERVIX: <0.5 cm:  25%:   -3:   posterior:   firm SVE:     CONTRACTIONS: irregular, every 5 minutes FHR: Fetal heart tracing reviewed. Variability: Good {> 6 bpm) Category I  50 cmg Misoprostol placed     Labs: Lab Results  Component Value Date   WBC 10.5 01/16/2018   HGB 13.6 01/16/2018   HCT 40.6 01/16/2018   MCV 78.7 (L) 01/16/2018   PLT 439 01/16/2018    ASSESSMENT: 1) Labor curve reviewed.       Progress: Not in labor.     Membranes: intact     @ 45min after Misoprostol placed - pt underwent SROM - thin meconium.  Went to bathroom.  Possible loss of Misoprostol?        Active Problems:   * No active hospital problems. *   PLAN: continue present management Re-evaluate at @2300  - consider misoprostol vs pitocin etc.  Elonda Huskyavid J. Evans, M.D. 01/16/2018 7:44 PM

## 2018-01-17 ENCOUNTER — Encounter: Payer: Self-pay | Admitting: Internal Medicine

## 2018-01-17 ENCOUNTER — Encounter
Admission: EM | Disposition: A | Discharge status: Home / Self Care | Payer: Self-pay | Source: Home / Self Care | Attending: Obstetrics and Gynecology

## 2018-01-17 ENCOUNTER — Inpatient Hospital Stay: Payer: Medicaid Other | Admitting: Certified Registered Nurse Anesthetist

## 2018-01-17 DIAGNOSIS — O4103X0 Oligohydramnios, third trimester, not applicable or unspecified: Secondary | ICD-10-CM | POA: Diagnosis present

## 2018-01-17 DIAGNOSIS — O212 Late vomiting of pregnancy: Secondary | ICD-10-CM | POA: Diagnosis present

## 2018-01-17 DIAGNOSIS — K219 Gastro-esophageal reflux disease without esophagitis: Secondary | ICD-10-CM | POA: Diagnosis present

## 2018-01-17 DIAGNOSIS — Z3A35 35 weeks gestation of pregnancy: Secondary | ICD-10-CM | POA: Diagnosis not present

## 2018-01-17 DIAGNOSIS — O365190 Maternal care for known or suspected placental insufficiency, unspecified trimester, not applicable or unspecified: Secondary | ICD-10-CM | POA: Diagnosis present

## 2018-01-17 DIAGNOSIS — O9962 Diseases of the digestive system complicating childbirth: Secondary | ICD-10-CM | POA: Diagnosis present

## 2018-01-17 DIAGNOSIS — E669 Obesity, unspecified: Secondary | ICD-10-CM | POA: Diagnosis present

## 2018-01-17 DIAGNOSIS — O99284 Endocrine, nutritional and metabolic diseases complicating childbirth: Secondary | ICD-10-CM | POA: Diagnosis present

## 2018-01-17 DIAGNOSIS — O99214 Obesity complicating childbirth: Secondary | ICD-10-CM | POA: Diagnosis present

## 2018-01-17 DIAGNOSIS — O365130 Maternal care for known or suspected placental insufficiency, third trimester, not applicable or unspecified: Secondary | ICD-10-CM | POA: Diagnosis not present

## 2018-01-17 DIAGNOSIS — Z3A36 36 weeks gestation of pregnancy: Secondary | ICD-10-CM

## 2018-01-17 DIAGNOSIS — O165 Unspecified maternal hypertension, complicating the puerperium: Secondary | ICD-10-CM | POA: Diagnosis present

## 2018-01-17 DIAGNOSIS — E876 Hypokalemia: Secondary | ICD-10-CM

## 2018-01-17 DIAGNOSIS — O99824 Streptococcus B carrier state complicating childbirth: Secondary | ICD-10-CM | POA: Diagnosis not present

## 2018-01-17 LAB — BASIC METABOLIC PANEL
Anion gap: 10 (ref 5–15)
BUN: 7 mg/dL (ref 6–20)
CO2: 28 mmol/L (ref 22–32)
Calcium: 8.7 mg/dL — ABNORMAL LOW (ref 8.9–10.3)
Chloride: 100 mmol/L (ref 98–111)
Creatinine, Ser: 0.66 mg/dL (ref 0.44–1.00)
GFR calc Af Amer: >60 mL/min (ref >60)
GFR calc non Af Amer: >60 mL/min (ref >60)
Glucose, Bld: 84 mg/dL (ref 70–99)
Potassium: 2.8 mmol/L — ABNORMAL LOW (ref 3.5–5.1)
Sodium: 138 mmol/L (ref 135–145)

## 2018-01-17 LAB — HEPATIC FUNCTION PANEL
ALT: 76 U/L — ABNORMAL HIGH (ref 0–44)
AST: 72 U/L — ABNORMAL HIGH (ref 15–41)
Albumin: 2.6 g/dL — ABNORMAL LOW (ref 3.5–5.0)
Alkaline Phosphatase: 242 U/L — ABNORMAL HIGH (ref 38–126)
Bilirubin, Direct: 0.4 mg/dL — ABNORMAL HIGH (ref 0.0–0.2)
Indirect Bilirubin: 0.8 mg/dL (ref 0.3–0.9)
Total Bilirubin: 1.2 mg/dL (ref 0.3–1.2)
Total Protein: 5.8 g/dL — ABNORMAL LOW (ref 6.5–8.1)

## 2018-01-17 LAB — DIFFERENTIAL
Basophils Absolute: 0.1 10*3/uL (ref 0–0.1)
Basophils Relative: 1 %
Eosinophils Absolute: 0 10*3/uL (ref 0–0.7)
Eosinophils Relative: 0 %
Lymphocytes Relative: 27 %
Lymphs Abs: 2.6 10*3/uL (ref 1.0–3.6)
Monocytes Absolute: 0.8 10*3/uL (ref 0.2–0.9)
Monocytes Relative: 8 %
Neutro Abs: 6.3 10*3/uL (ref 1.4–6.5)
Neutrophils Relative %: 64 %

## 2018-01-17 LAB — CBC
HCT: 36 % (ref 35.0–47.0)
Hemoglobin: 12.3 g/dL (ref 12.0–16.0)
MCH: 26.6 pg (ref 26.0–34.0)
MCHC: 34 g/dL (ref 32.0–36.0)
MCV: 78.2 fL — ABNORMAL LOW (ref 80.0–100.0)
Platelets: 396 10*3/uL (ref 150–440)
RBC: 4.61 MIL/uL (ref 3.80–5.20)
RDW: 14.7 % — ABNORMAL HIGH (ref 11.5–14.5)
WBC: 9.8 10*3/uL (ref 3.6–11.0)

## 2018-01-17 LAB — POTASSIUM: Potassium: 3.5 mmol/L (ref 3.5–5.1)

## 2018-01-17 LAB — PROTEIN / CREATININE RATIO, URINE
Creatinine, Urine: 125 mg/dL
Protein Creatinine Ratio: 0.11 mg/mg{Cre} (ref 0.00–0.15)
Total Protein, Urine: 14 mg/dL

## 2018-01-17 LAB — APTT: aPTT: 26 seconds (ref 24–36)

## 2018-01-17 LAB — SYPHILIS: RPR W/REFLEX TO RPR TITER AND TREPONEMAL ANTIBODIES, TRADITIONAL SCREENING AND DIAGNOSIS ALGORITHM: RPR Ser Ql: NONREACTIVE

## 2018-01-17 LAB — MAGNESIUM: Magnesium: 2 mg/dL (ref 1.7–2.4)

## 2018-01-17 LAB — PROTIME-INR
INR: 0.95
Prothrombin Time: 12.6 seconds (ref 11.4–15.2)

## 2018-01-17 LAB — TSH: TSH: 0.956 u[IU]/mL (ref 0.350–4.500)

## 2018-01-17 SURGERY — Surgical Case
Anesthesia: Spinal | Site: Abdomen | Wound class: Clean Contaminated

## 2018-01-17 MED ORDER — DIPHENHYDRAMINE HCL 50 MG/ML IJ SOLN
12.5000 mg | INTRAMUSCULAR | Status: DC | PRN
Start: 2018-01-17 — End: 2018-01-18

## 2018-01-17 MED ORDER — DEXAMETHASONE SODIUM PHOSPHATE 10 MG/ML IJ SOLN
INTRAMUSCULAR | Status: DC | PRN
  Administered 2018-01-17: 10 mg via INTRAVENOUS

## 2018-01-17 MED ORDER — LACTATED RINGERS IV SOLN
INTRAVENOUS | Status: DC | PRN
  Administered 2018-01-17: 21:00:00 via INTRAVENOUS

## 2018-01-17 MED ORDER — FENTANYL CITRATE (PF) 100 MCG/2ML IJ SOLN: 25.0000 ug | INTRAMUSCULAR | Status: DC | PRN

## 2018-01-17 MED ORDER — OXYTOCIN 40 UNITS IN LACTATED RINGERS INFUSION - SIMPLE MED
INTRAVENOUS | Status: DC | PRN
  Administered 2018-01-17: 500 mL via INTRAVENOUS

## 2018-01-17 MED ORDER — PHENYLEPHRINE HCL 10 MG/ML IJ SOLN
INTRAMUSCULAR | Status: DC | PRN
  Administered 2018-01-17 (×6): 100 ug via INTRAVENOUS

## 2018-01-17 MED ORDER — NALOXONE HCL 0.4 MG/ML IJ SOLN: 0.4000 mg | INTRAMUSCULAR | Status: DC | PRN

## 2018-01-17 MED ORDER — OXYCODONE-ACETAMINOPHEN 5-325 MG PO TABS
2.0000 | ORAL_TABLET | ORAL | Status: DC | PRN
  Administered 2018-01-20: 2 via ORAL
  Filled 2018-01-17: qty 2

## 2018-01-17 MED ORDER — MEPERIDINE HCL 25 MG/ML IJ SOLN: 6.2500 mg | INTRAMUSCULAR | Status: DC | PRN

## 2018-01-17 MED ORDER — IBUPROFEN 600 MG PO TABS
600.0000 mg | ORAL_TABLET | Freq: Four times a day (QID) | ORAL | Status: DC
  Administered 2018-01-18 – 2018-01-20 (×9): 600 mg via ORAL
  Filled 2018-01-17 (×10): qty 1

## 2018-01-17 MED ORDER — LIDOCAINE 5 % EX PTCH
1.0000 | MEDICATED_PATCH | CUTANEOUS | Status: DC
  Administered 2018-01-19 – 2018-01-20 (×2): 1 via TRANSDERMAL
  Filled 2018-01-17 (×4): qty 1

## 2018-01-17 MED ORDER — SOD CITRATE-CITRIC ACID 500-334 MG/5ML PO SOLN
ORAL | Status: AC
  Administered 2018-01-17: 30 mL via ORAL
  Filled 2018-01-17: qty 15

## 2018-01-17 MED ORDER — SOD CITRATE-CITRIC ACID 500-334 MG/5ML PO SOLN
30.0000 mL | ORAL | Status: AC
  Administered 2018-01-17: 30 mL via ORAL

## 2018-01-17 MED ORDER — LIDOCAINE 5 % EX PTCH
MEDICATED_PATCH | CUTANEOUS | Status: DC | PRN
  Administered 2018-01-17: 1 via TRANSDERMAL

## 2018-01-17 MED ORDER — BUPIVACAINE IN DEXTROSE 0.75-8.25 % IT SOLN
INTRATHECAL | Status: DC | PRN
  Administered 2018-01-17: 1.6 mL via INTRATHECAL

## 2018-01-17 MED ORDER — SENNOSIDES-DOCUSATE SODIUM 8.6-50 MG PO TABS
2.0000 | ORAL_TABLET | ORAL | Status: DC
  Administered 2018-01-19 – 2018-01-20 (×2): 2 via ORAL
  Filled 2018-01-17 (×3): qty 2

## 2018-01-17 MED ORDER — DIPHENHYDRAMINE HCL 25 MG PO CAPS: 25.0000 mg | ORAL_CAPSULE | ORAL | Status: DC | PRN

## 2018-01-17 MED ORDER — CEFAZOLIN SODIUM-DEXTROSE 2-4 GM/100ML-% IV SOLN
2.0000 g | Freq: Three times a day (TID) | INTRAVENOUS | Status: DC
  Administered 2018-01-17: 2 g via INTRAVENOUS
  Filled 2018-01-17 (×3): qty 100

## 2018-01-17 MED ORDER — NALBUPHINE HCL 10 MG/ML IJ SOLN: 5.0000 mg | INTRAMUSCULAR | Status: DC | PRN

## 2018-01-17 MED ORDER — PRENATAL MULTIVITAMIN CH
1.0000 | ORAL_TABLET | Freq: Every day | ORAL | Status: DC
  Administered 2018-01-18 – 2018-01-20 (×3): 1 via ORAL
  Filled 2018-01-17 (×3): qty 1

## 2018-01-17 MED ORDER — OXYTOCIN 40 UNITS IN LACTATED RINGERS INFUSION - SIMPLE MED
2.5000 [IU]/h | INTRAVENOUS | Status: DC
  Administered 2018-01-17: 2.5 [IU]/h via INTRAVENOUS

## 2018-01-17 MED ORDER — NALBUPHINE HCL 10 MG/ML IJ SOLN: 5.0000 mg | Freq: Once | INTRAMUSCULAR | Status: DC | PRN

## 2018-01-17 MED ORDER — KETOROLAC TROMETHAMINE 30 MG/ML IJ SOLN
30.0000 mg | Freq: Four times a day (QID) | INTRAMUSCULAR | Status: AC | PRN
  Filled 2018-01-17: qty 1

## 2018-01-17 MED ORDER — ONDANSETRON HCL 4 MG/2ML IJ SOLN
INTRAMUSCULAR | Status: AC
  Filled 2018-01-17: qty 2

## 2018-01-17 MED ORDER — ONDANSETRON HCL 4 MG/2ML IJ SOLN: 4.0000 mg | Freq: Once | INTRAMUSCULAR | Status: DC | PRN

## 2018-01-17 MED ORDER — NALBUPHINE HCL 10 MG/ML IJ SOLN
5.0000 mg | INTRAMUSCULAR | Status: DC | PRN
  Administered 2018-01-18: 5 mg via INTRAVENOUS
  Filled 2018-01-17: qty 1

## 2018-01-17 MED ORDER — LACTATED RINGERS IV SOLN: INTRAVENOUS | Status: DC

## 2018-01-17 MED ORDER — MORPHINE SULFATE (PF) 0.5 MG/ML IJ SOLN
INTRAMUSCULAR | Status: DC | PRN
  Administered 2018-01-17: .1 mg via INTRATHECAL

## 2018-01-17 MED ORDER — FENTANYL CITRATE (PF) 100 MCG/2ML IJ SOLN
INTRAMUSCULAR | Status: DC | PRN
  Administered 2018-01-17: 15 ug via INTRATHECAL

## 2018-01-17 MED ORDER — DIPHENHYDRAMINE HCL 25 MG PO CAPS: 25.0000 mg | ORAL_CAPSULE | Freq: Four times a day (QID) | ORAL | Status: DC | PRN

## 2018-01-17 MED ORDER — MORPHINE SULFATE (PF) 0.5 MG/ML IJ SOLN
INTRAMUSCULAR | Status: AC
  Filled 2018-01-17: qty 10

## 2018-01-17 MED ORDER — POTASSIUM CHLORIDE 2 MEQ/ML IV SOLN
INTRAVENOUS | Status: DC
  Filled 2018-01-17 (×6): qty 1000

## 2018-01-17 MED ORDER — PROMETHAZINE HCL 25 MG/ML IJ SOLN
25.0000 mg | Freq: Four times a day (QID) | INTRAMUSCULAR | Status: DC | PRN
Start: 2018-01-17 — End: 2018-01-20
  Administered 2018-01-17: 25 mg via INTRAVENOUS

## 2018-01-17 MED ORDER — CEFAZOLIN SODIUM 1 G IJ SOLR
2.0000 g | Freq: Once | INTRAMUSCULAR | Status: DC
  Filled 2018-01-17: qty 20

## 2018-01-17 MED ORDER — SODIUM CHLORIDE 0.9% FLUSH: 3.0000 mL | INTRAVENOUS | Status: DC | PRN

## 2018-01-17 MED ORDER — ONDANSETRON HCL 4 MG/2ML IJ SOLN
INTRAMUSCULAR | Status: DC | PRN
  Administered 2018-01-17: 4 mg via INTRAVENOUS

## 2018-01-17 MED ORDER — OXYCODONE-ACETAMINOPHEN 5-325 MG PO TABS: 1.0000 | ORAL_TABLET | ORAL | Status: DC | PRN

## 2018-01-17 MED ORDER — KETOROLAC TROMETHAMINE 30 MG/ML IJ SOLN
30.0000 mg | Freq: Four times a day (QID) | INTRAMUSCULAR | Status: AC | PRN
  Administered 2018-01-18 (×2): 30 mg via INTRAVENOUS
  Filled 2018-01-17: qty 1

## 2018-01-17 MED ORDER — POTASSIUM CHLORIDE 10 MEQ/100ML IV SOLN
10.0000 meq | INTRAVENOUS | Status: AC
  Administered 2018-01-17 (×3): 10 meq via INTRAVENOUS
  Filled 2018-01-17 (×3): qty 100

## 2018-01-17 MED ORDER — DEXAMETHASONE SODIUM PHOSPHATE 10 MG/ML IJ SOLN
INTRAMUSCULAR | Status: AC
  Filled 2018-01-17: qty 1

## 2018-01-17 MED ORDER — MENTHOL 3 MG MT LOZG
1.0000 | LOZENGE | OROMUCOSAL | Status: DC | PRN
  Filled 2018-01-17: qty 9

## 2018-01-17 MED ORDER — ZOLPIDEM TARTRATE 5 MG PO TABS: 5.0000 mg | ORAL_TABLET | Freq: Every evening | ORAL | Status: DC | PRN

## 2018-01-17 MED ORDER — POTASSIUM CHLORIDE 10 MEQ/50ML IV SOLN: 10.0000 meq | INTRAVENOUS | Status: DC | PRN

## 2018-01-17 MED ORDER — FENTANYL CITRATE (PF) 100 MCG/2ML IJ SOLN
INTRAMUSCULAR | Status: AC
  Filled 2018-01-17: qty 2

## 2018-01-17 MED ORDER — ACETAMINOPHEN 500 MG PO TABS
1000.0000 mg | ORAL_TABLET | Freq: Four times a day (QID) | ORAL | Status: AC
  Administered 2018-01-18 (×4): 1000 mg via ORAL
  Filled 2018-01-17 (×4): qty 2

## 2018-01-17 MED ORDER — ACETAMINOPHEN 325 MG PO TABS
650.0000 mg | ORAL_TABLET | ORAL | Status: DC | PRN
  Administered 2018-01-19 – 2018-01-20 (×3): 650 mg via ORAL
  Filled 2018-01-17 (×3): qty 2

## 2018-01-17 MED ORDER — NALOXONE HCL 4 MG/10ML IJ SOLN
1.0000 ug/kg/h | INTRAVENOUS | Status: DC | PRN
  Filled 2018-01-17: qty 5

## 2018-01-17 MED ORDER — SIMETHICONE 80 MG PO CHEW
80.0000 mg | CHEWABLE_TABLET | Freq: Four times a day (QID) | ORAL | Status: DC
  Administered 2018-01-18 – 2018-01-20 (×10): 80 mg via ORAL
  Filled 2018-01-17 (×10): qty 1

## 2018-01-17 MED ORDER — PROMETHAZINE HCL 25 MG/ML IJ SOLN
INTRAMUSCULAR | Status: AC
  Filled 2018-01-17: qty 1

## 2018-01-17 SURGICAL SUPPLY — 21 items
ADHESIVE MASTISOL STRL (MISCELLANEOUS) ×2 IMPLANT
BAG COUNTER SPONGE EZ (MISCELLANEOUS) ×2 IMPLANT
CANISTER SUCT 3000ML PPV (MISCELLANEOUS) ×2 IMPLANT
CHLORAPREP W/TINT 26ML (MISCELLANEOUS) ×4 IMPLANT
DRSG TELFA 3X8 NADH (GAUZE/BANDAGES/DRESSINGS) ×2 IMPLANT
GAUZE SPONGE 4X4 12PLY STRL (GAUZE/BANDAGES/DRESSINGS) ×2 IMPLANT
GLOVE BIOGEL PI ORTHO PRO 7.5 (GLOVE) ×7
GLOVE PI ORTHO PRO STRL 7.5 (GLOVE) ×7 IMPLANT
GOWN STRL REUS W/ TWL LRG LVL3 (GOWN DISPOSABLE) ×4 IMPLANT
GOWN STRL REUS W/TWL LRG LVL3 (GOWN DISPOSABLE) ×4
KIT TURNOVER KIT A (KITS) ×2 IMPLANT
NS IRRIG 1000ML POUR BTL (IV SOLUTION) ×2 IMPLANT
PACK C SECTION AR (MISCELLANEOUS) ×2 IMPLANT
PAD OB MATERNITY 4.3X12.25 (PERSONAL CARE ITEMS) ×2 IMPLANT
PAD PREP 24X41 OB/GYN DISP (PERSONAL CARE ITEMS) ×2 IMPLANT
RTRCTR C-SECT PINK 25CM LRG (MISCELLANEOUS) ×2 IMPLANT
SPONGE LAP 18X18 RF (DISPOSABLE) ×2 IMPLANT
SUT VIC AB 0 CTX 36 (SUTURE) ×2
SUT VIC AB 0 CTX36XBRD ANBCTRL (SUTURE) ×2 IMPLANT
SUT VIC AB 1 CT1 36 (SUTURE) ×4 IMPLANT
SUT VICRYL+ 3-0 36IN CT-1 (SUTURE) ×4 IMPLANT

## 2018-01-17 NOTE — Anesthesia Preprocedure Evaluation (Signed)
Anesthesia Evaluation  Patient identified by MRN, date of birth, ID band Patient awake    Reviewed: Allergy & Precautions, NPO status , Patient's Chart, lab work & pertinent test results  Airway Mallampati: II       Dental   Pulmonary neg sleep apnea, neg COPD,           Cardiovascular (-) hypertension(-) Past MI and (-) CHF (-) dysrhythmias (-) Valvular Problems/Murmurs     Neuro/Psych neg Seizures    GI/Hepatic Neg liver ROS, GERD  Medicated,  Endo/Other  neg diabetes  Renal/GU negative Renal ROS     Musculoskeletal   Abdominal   Peds  Hematology   Anesthesia Other Findings   Reproductive/Obstetrics                             Anesthesia Physical Anesthesia Plan  ASA: II and emergent  Anesthesia Plan: Spinal   Post-op Pain Management:    Induction:   PONV Risk Score and Plan:   Airway Management Planned:   Additional Equipment:   Intra-op Plan:   Post-operative Plan:   Informed Consent: I have reviewed the patients History and Physical, chart, labs and discussed the procedure including the risks, benefits and alternatives for the proposed anesthesia with the patient or authorized representative who has indicated his/her understanding and acceptance.     Plan Discussed with:   Anesthesia Plan Comments:         Anesthesia Quick Evaluation

## 2018-01-17 NOTE — Anesthesia Post-op Follow-up Note (Signed)
Anesthesia QCDR form completed.        

## 2018-01-17 NOTE — Op Note (Signed)
      OP NOTE  Date: 01/17/2018   10:53 PM Name Cindy Gibson MR# [id number]  Preoperative Diagnosis: 1. Intrauterine pregnancy at 2965w4d Active Problems:   Placental insufficiency  2.  non-reassuring fetal status and oligohydramnios  Postoperative Diagnosis: 1. Intrauterine pregnancy at 6765w4d, delivered 2. Viable infant 3. Remainder same as pre-op   Procedure: 1. Primary Low-Transverse Cesarean Section  Surgeon: Elonda Huskyavid J. Alexande Sheerin, MD  Assistant:    Anesthesia:      EBL: 200  ml     Findings: 1) female infant, Apgar scores of 8    at 1 minute and 9    at 5 minutes and a birthweight of 69.14  ounces.    2) Normal uterus, tubes and ovaries.    Procedure:  The patient was prepped and draped in the supine position and placed under spinal anesthesia.  A transverse incision was made across the abdomen in a Pfannenstiel manner. If indicated the old scar was systematically removed with sharp dissection.  We carried the dissection down to the level of the fascia.  The fascia was incised in a curvilinear manner.  The fascia was then elevated from the rectus muscles with blunt and sharp dissection.  The rectus muscles were separated laterally exposing the peritoneum.  The peritoneum was carefully entered with care being taken to avoid bowel and bladder.  A self-retaining retractor was placed.  The visceral peritoneum was incised in a curvilinear fashion across the lower uterine segment creating a bladder flap. A transverse incision was made across the lower uterine segment and extended laterally and superiorly using the bandage scissors.  Artificial rupture membranes was performed and minimal clear fluid was noted.  The infant was delivered from the cephalic position.  A nuchal cord was not present. The cord was doubly clamped and cut. Cord blood was obtained if appropriate.  The infant was handed to the pediatric personnel  who then placed the infant under heat lamps where it was  cleaned dried and re-suctioned. The placenta was delivered. The hysterotomy incision was then identified on ring forceps.  The uterine cavity was cleaned with a moist lap sponge.  The hysterotomy incision was closed with a running interlocking suture of Vicryl.  Hemostasis was excellent.  Pitocin was run in the IV and the uterus was found to be firm. The posterior cul-de-sac and gutters were cleaned and inspected.  Hemostasis was noted.  The fascia was then closed with a running suture of #1 Vicryl.  Hemostasis of the subcutaneous tissues was obtained using the Bovie.  The subcutaneous tissues were closed with a running suture of 000 Vicryl.  A subcuticular suture was placed.  Steri-Strips were applied in the usual manner.  A pressure dressing was placed.  The patient went to the recovery room in stable condition.   Elonda Huskyavid J. Shatyra Longshore, M.D. 01/17/2018 10:53 PM

## 2018-01-17 NOTE — Progress Notes (Signed)
Pharmacy Electrolyte Monitoring Consult:  Pharmacy consulted to assist in monitoring and replacing electrolytes in this 27 y.o. female admitted on 01/16/2018 with Nausea and Emesis  hyperemesis d/t pregnancy  Labs:  Sodium (mmol/L)  Date Value  01/17/2018 138  11/16/2012 139   Potassium (mmol/L)  Date Value  01/17/2018 2.8 (L)  11/16/2012 4.1   Magnesium (mg/dL)  Date Value  16/10/960407/18/2019 2.0   Calcium (mg/dL)  Date Value  54/09/811907/18/2019 8.7 (L)   Calcium, Total (mg/dL)  Date Value  14/78/295605/17/2014 8.6 (L)   Albumin (g/dL)  Date Value  21/30/865707/18/2019 2.6 (L)  11/16/2012 4.1    Assessment/Plan: K 2.8  Mag 2.0  Scr 0.66 MD has currently ordered KCL IV 10 meq x 3 and IVF of LR w/ 30 meq/L at 100 ml/hr. Will reassess K level at 1800 and labs in am   Daryle Boyington A 01/17/2018 1:24 PM

## 2018-01-17 NOTE — Consult Note (Signed)
[employer] - [place] at [facility]   PATIENT NAME: Cindy Gibson    MR#:  [id number]  DATE OF BIRTH:  1991/01/09  DATE OF ADMISSION:  01/16/2018  PRIMARY CARE PHYSICIAN: Dr Brennan Bailey  REQUESTING/REFERRING PHYSICIAN: Dr Brennan Bailey  CHIEF COMPLAINT:   Chief Complaint  Patient presents with  . Nausea  . Emesis    HISTORY OF PRESENT ILLNESS:  Cindy Gibson  is a 27 y.o. female with a known history of nausea and vomiting with her pregnancy.  She states that she has had nausea on and off through the entire pregnancy.  She states that she is unable to keep anything down and she has been vomiting.  She is been having heartburn during the entire pregnancy.  She is been taking Zantac at home.  Hospitalist services were contacted for hypokalemia.  PAST MEDICAL HISTORY:   Past Medical History:  Diagnosis Date  . GERD (gastroesophageal reflux disease)   . Medical history non-contributory     PAST SURGICAL HISTOIRY:   Past Surgical History:  Procedure Laterality Date  . DILATION AND CURETTAGE OF UTERUS      SOCIAL HISTORY:   Social History   Tobacco Use  . Smoking status: Never Smoker  . Smokeless tobacco: Never Used  Substance Use Topics  . Alcohol use: Never    Frequency: Never    FAMILY HISTORY:   Family History  Problem Relation Age of Onset  . Diabetes Mother   . Rheum arthritis Mother   . Diabetes Maternal Grandmother   . Rheum arthritis Maternal Grandmother   . CAD Maternal Grandfather   . Cancer Neg Hx     DRUG ALLERGIES:  No Known Allergies  REVIEW OF SYSTEMS:  CONSTITUTIONAL: No fever, chills or sweats.  Positive for fatigue.  Patient states that she has not been sleeping well. EYES: No blurred or double vision.  EARS, NOSE, AND THROAT: No tinnitus or ear pain.  RESPIRATORY: No cough, shortness of breath, wheezing or hemoptysis.  CARDIOVASCULAR:  positive for heartburn type chest pain.  No orthopnea, edema.  GASTROINTESTINAL:  Positive for nausea  and vomiting.  No diarrhea or abdominal pain.  GENITOURINARY: No dysuria, hematuria.  ENDOCRINE: No polyuria, nocturia,  HEMATOLOGY: No anemia, easy bruising or bleeding SKIN: No rash or lesion. MUSCULOSKELETAL: No joint pain or arthritis.   NEUROLOGIC: No tingling, numbness, weakness.  PSYCHIATRY: No anxiety or depression.   MEDICATIONS AT HOME:   Prior to Admission medications   Medication Sig Start Date End Date Taking? Authorizing Provider  pantoprazole (PROTONIX) 20 MG tablet Take 1 tablet (20 mg total) by mouth 2 (two) times daily. 12/13/17  Yes Hildred Laser, MD  Prenatal Vit-Fe Fumarate-FA (MULTIVITAMIN-PRENATAL) 27-0.8 MG TABS tablet Take 1 tablet by mouth daily at 12 noon.   Yes [provider]  promethazine (PHENERGAN) 25 MG suppository Place 1 suppository (25 mg total) rectally every 6 (six) hours as needed for nausea. 01/13/18 01/13/19 Yes Linzie Collin, MD      VITAL SIGNS:  Blood pressure 140/88, pulse (!) 110, temperature 97.8 F (36.6 C), temperature source Oral, resp. rate 18, height 5\' 1"  (1.549 m), weight 75.3 kg (166 lb), SpO2 99 %.  PHYSICAL EXAMINATION:  GENERAL:  27 y.o.-year-old patient lying in the bed with no acute distress.  EYES: Pupils equal, round, reactive to light and accommodation. No scleral icterus. Extraocular muscles intact.  HEENT: Head atraumatic, normocephalic. Oropharynx and nasopharynx clear.  NECK:  Supple, no jugular venous distention.  No thyroid enlargement, no tenderness.  LUNGS: Normal breath sounds bilaterally, no wheezing, rales,rhonchi or crepitation. No use of accessory muscles of respiration.  CARDIOVASCULAR: S1, S2 normal. No murmurs, rubs, or gallops.  ABDOMEN: Soft, nontender, nondistended. Bowel sounds present. No organomegaly or mass.  EXTREMITIES: No pedal edema, cyanosis, or clubbing.  NEUROLOGIC: Cranial nerves II through XII are intact. Muscle strength 5/5 in all extremities. Sensation  intact. Gait not checked.  PSYCHIATRIC: The patient is alert and oriented x 3.  SKIN: No obvious rash, lesion, or ulcer.   LABORATORY PANEL:   CBC Recent Labs  Lab 01/17/18 0859  WBC 9.8  HGB 12.3  HCT 36.0  PLT 396   ------------------------------------------------------------------------------------------------------------------  Chemistries  Recent Labs  Lab 01/17/18 0859  NA 138  K 2.8*  CL 100  CO2 28  GLUCOSE 84  BUN 7  CREATININE 0.66  CALCIUM 8.7*  MG 2.0  AST 72*  ALT 76*  ALKPHOS 242*  BILITOT 1.2   ------------------------------------------------------------------------------------------------------------------   RADIOLOGY:  [place] Fetal Bpp W/nonstress  Result Date: 01/16/2018 CLINICAL DATA:  Pregnant, decreased fetal movement, nausea/vomiting EXAM: LIMITED OBSTETRIC ULTRASOUND AND BIOPHYSICAL PROFILE FINDINGS: Number of Fetuses: 1 Heart Rate:  149 bpm Movement: Yes Presentation: Cephalic Placental Location: Not assessed Previa: Not evaluated Amniotic Fluid (Subjective):  Decreased.  AFI 4.2 cm. MATERNAL FINDINGS: Cervix:  Not evaluated. Uterus/Adnexae: Not evaluated. Movement:  2  Time: 19 minutes Breathing: 2 Tone:  2 Amniotic Fluid: 0 Total Score:  6 IMPRESSION: Single live intrauterine gestation, as above. Fetal heart rate 149 beats per minute. Biophysical profile score is 6 out of 8, secondary to oligohydramnios. This exam is performed on an emergent basis and does not comprehensively evaluate fetal size, dating, or anatomy; follow-up complete OB US should be considered if further fetal assessment is warranted. Electronically Signed   By: Charline BillsSriyesh  Krishnan M.D.   On: 01/16/2018 17:46    EKG:   Normal sinus rhythm 76 bpm  IMPRESSION AND PLAN:   1.  Hypokalemia.  I added on a magnesium to the morning labs and this was normal range.  Potassium supplementation being given via IV with 3 runs and then LR with KCl after that.  We will check a potassium level at  3:30 PM.  If it is above 3 the patient will be fine to go for C-section. 2.  Persistent nausea vomiting and GERD likely related to the pregnancy.  PRN nausea medications and acid blockers.  If this persists after delivery can consider ultrasound of the right upper quadrant to look at the gallbladder. 3.  Pregnancy 36 weeks and 4 days 4.  Slight elevation in liver function test.  Continue to monitor.  This also should improve after delivery.  All the records are reviewed and case discussed with Consulting provider. Management plans discussed with the patient, family and they are in agreement.  CODE STATUS: full code  TOTAL TIME TAKING CARE OF THIS PATIENT: 50 minutes.    Alford Highlandichard Nymir Ringler M.D on 01/17/2018 at 1:49 PM  Between 7am to 6pm - Pager - [phone]  After 6pm go to [email or web address] - password [email or web address]  [employer]  Office  [phone]  CC: Primary care Physician: Dr Brennan Baileyavid Evans

## 2018-01-17 NOTE — Progress Notes (Signed)
Pharmacy Electrolyte Monitoring Consult:  Pharmacy consulted to assist in monitoring and replacing electrolytes in this 27 y.o. female admitted on 01/16/2018 with Nausea and Emesis  hyperemesis d/t pregnancy  Labs:  Sodium (mmol/L)  Date Value  01/17/2018 138  11/16/2012 139   Potassium (mmol/L)  Date Value  01/17/2018 3.5  11/16/2012 4.1   Magnesium (mg/dL)  Date Value  78/29/562107/18/2019 2.0   Calcium (mg/dL)  Date Value  30/86/578407/18/2019 8.7 (L)   Calcium, Total (mg/dL)  Date Value  69/62/952805/17/2014 8.6 (L)   Albumin (g/dL)  Date Value  41/32/440107/18/2019 2.6 (L)  11/16/2012 4.1    Assessment/Plan: K 2.8  Mag 2.0  Scr 0.66 MD has currently ordered KCL IV 10 meq x 3 and IVF of LR w/ 30 meq/L at 100 ml/hr. Will reassess K level at 1800 and labs in am   7/18  K @ 1528= 3.5. No additional supplementation at this time.F/u in am  Mekia Dipinto A 01/17/2018 6:14 PM

## 2018-01-17 NOTE — Consult Note (Signed)
[facility] Consultation   Chief Complaint: Admitted yesterday with persistent nausea and vomiting, ketonuria and variable decels with BPP ultimately 8/10, but AFI of 4 cm.  HPI: [name] is a 27 y.o. G2P0010 at 6743w4d by her stated EDD, and 8060w6d by a 173w3d US performed at [facility] [place] on 07/31/2017, who is referred by Dr. Brennan Baileyavid Evans for persistent nausea and vomiting, ketonuria and variable decels with BPP ultimately 8/10, but AFI of 4 cm.  During observation, she had the equivalent of a negative CST.    Her pregnancy has been complicated by nausea and vomiting since the beginning.  Since she transferred her care to [facility] from [place]  at 29 weeks, she has been admitted twice for nausea and vomiting .  Once from 5/28 - 11/30/2017 and most recently on 01/13/2018.  During the 11/27/2017 admission, she was treated for a UTI.  On admission, she had variable decelerations.  BP was 147/86.  A BPP was 6/8 with an AFI of 4 cm.  I have reviewed the films, and there is a 2 cm x 2 cm pocket.  There are also multiple placental calcifications.  Obstetric History:  OB History  Gravida Para Term Preterm AB Living  2 0 0 0 1 0  SAB TAB Ectopic Multiple Live Births  1 0 0 0 0    # Outcome Date GA Lbr Len/2nd Weight Sex Delivery Anes PTL Lv  2 Current           1 SAB  420w0d           Past Medical History: Patient  has a past medical history of Medical history non-contributory.   Past Surgical History: She  has a past surgical history that includes Dilation and curettage of uterus.   Medications: She @[facility]EDP @   Allergies: Patient has No Known Allergies.   Social History: Patient  reports that she has never smoked. She has never used smokeless tobacco. She reports that she does not drink alcohol or use drugs.   Family History: family history includes Diabetes in her maternal grandmother and mother.   Review of Systems Nausea, has been  vomiting, bad heartburn this pregnancy.  A full 12 point review of systems was negative or as noted in the History of Present Illness.  Physical Exam: BP 136/84 (BP Location: Left Arm)   Pulse 89   Temp 97.8 F (36.6 C) (Oral)   Resp 18   Ht 5\' 1"  (1.549 m)   Wt 166 lb (75.3 kg)   BMI 31.37 kg/m    Exam: Neck - no nodes or masses Lungs - clear Heart  - RRR Abdomen - nontender Pelvic - deferred  Cervical exam yesterday per Dr. Logan BoresEvans - essentially closed/25%/-3 station  US 12/13/2017 EFW 1604 g, I have reviewed these films.  There is subjectively low amniotic fluid volume.  01/16/2018 - UDS positive for cannabinoids  FHR tracing this morning - 140 bpm with variable decelerations  Lab Results  Component Value Date   WBC 9.8 01/17/2018   HGB 12.3 01/17/2018   HCT 36.0 01/17/2018   MCV 78.2 (L) 01/17/2018   PLT 396 01/17/2018    01/17/2018: BMP today - creat 0.66; potassium 2.8 (3.5 - 5.1), magnesium 2.0 Hepatic profile today - AST 72, ALT 76 (mildly elevated), and bilirubin 0.4 P/C ratio 0.11  Asessement: 1. 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 Category 2 tracing 3. GBS  positive 4. Hypokalemia due to nausea and vomiting 5. Persistent nausea and vomiting 6. Substance abuse 7. Obesity with BMI > 30 8. Mildly elevated ALT, normal bili, likely due to dehydration, not consistent with HELLP syndrome or AFLP   Recommendations: 1. 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 2. Category 2 tracing --  Low threshold for cesarean delivery.  3. GBS positive --  Start GBS prophylaxis if or when induction is started 4. Hypokalemia due to nausea and vomiting -- Baseline EKG -- I placed a consult with Dr. Hilton Sinclair, hospitalist, who will place  electrolyte protocol consult with pharmacy and place orders for telemetry while patient is hypokalemic 5. Persistent nausea and vomiting in the setting of "bad heartburn" -- TSH ordered -- GI consult PP if nausea, vomiting and bad heartburn persist 6. Substance abuse --  Hepatitis C antibody testing ordered --  Social work consult and follow-up 7. Obesity with BMI > 30 8. Mildly elevated ALT, normal bili, likely due to dehydration, not consistent with HELLP syndrome or AFLP -- Hep B is pending -- Hep C antibody testing ordered -- PT/PTT ordered  Total time spent with the patient was 60 minutes with greater than 50% spent in counseling and coordination of care.  We appreciate the consult and will be happy to be involved in the ongoing care of Cindy Gibson in anyway her obstetricians desire.  Argentina Ponder, MD [facility]

## 2018-01-17 NOTE — H&P (Signed)
History and Physical   HPI  Cindy Gibson is a 27 y.o. G2P0010 at 9961w4d EstimatKarlyn Ageeed Date of Delivery: 02/10/18 who is being admitted for persistent nausea and vomiting at 36 weeks.  Placental insufficiency based on category 2 fetal monitor strip and low AFI.  Hypokalemia likely secondary to nausea and vomiting.   OB History  OB History  Gravida Para Term Preterm AB Living  2 0 0 0 1 0  SAB TAB Ectopic Multiple Live Births  1 0 0 0 0    # Outcome Date GA Lbr Len/2nd Weight Sex Delivery Anes PTL Lv  2 Current           1 SAB  6633w0d           PROBLEM LIST  Pregnancy complications or risks: Patient Active Problem List   Diagnosis Date Noted  . Hyperemesis affecting pregnancy, antepartum 01/13/2018  . Pregnancy 11/30/2017  . Labor and delivery indication for care or intervention 11/27/2017    Placental insufficiency  Prenatal labs and studies: ABO, Rh: --/--/O POS (07/17 1626) Antibody: NEG (07/17 1626) Rubella:   RPR: Non Reactive (07/17 1626)  HBsAg:    HIV:    EAV:[id number]:Positive (07/10 1648)   Past Medical History:  Diagnosis Date  . Medical history non-contributory      Past Surgical History:  Procedure Laterality Date  . DILATION AND CURETTAGE OF UTERUS       Medications    Current Discharge Medication List    CONTINUE these medications which have NOT CHANGED   Details  pantoprazole (PROTONIX) 20 MG tablet Take 1 tablet (20 mg total) by mouth 2 (two) times daily. Qty: 60 tablet, Refills: 2    Prenatal Vit-Fe Fumarate-FA (MULTIVITAMIN-PRENATAL) 27-0.8 MG TABS tablet Take 1 tablet by mouth daily at 12 noon.    promethazine (PHENERGAN) 25 MG suppository Place 1 suppository (25 mg total) rectally every 6 (six) hours as needed for nausea. Qty: 6 suppository, Refills: 0         Allergies  Patient has no known allergies.  Review of Systems  Pertinent items are noted in HPI.  Physical Exam  BP 136/84 (BP Location: Left Arm)   Pulse 89    Temp 97.8 F (36.6 C) (Oral)   Resp 18   Ht 5\' 1"  (1.549 m)   Wt 166 lb (75.3 kg)   SpO2 97%   BMI 31.37 kg/m   Lungs:  CTA B Cardio: RRR without M/R/G Abd: Soft, gravid, NT Presentation: cephalic EXT: No C/C/ 1+ Edema DTRs: 2+ B CERVIX:  Unfavorable  EKG-normal sinus rhythm appears normal Ultrasound reveals AFI of 4.   See Prenatal records for more detailed PE.     FHR:  Category 2 -based on occasional late decelerations and occasional variables.  Toco: Uterine Contractions: Irregular mild    Test Results  Results for orders placed or performed during the hospital encounter of 01/16/18 (from the past 24 hour(s))  CBC with Differential/Platelet     Status: Abnormal   Collection Time: 01/16/18  4:26 PM  Result Value Ref Range   WBC 10.5 3.6 - 11.0 K/uL   RBC 5.16 3.80 - 5.20 MIL/uL   Hemoglobin 13.6 12.0 - 16.0 g/dL   HCT 47.840.6 29.535.0 - 62.147.0 %   MCV 78.7 (L) 80.0 - 100.0 fL   MCH 26.3 26.0 - 34.0 pg   MCHC 33.4 32.0 - 36.0 g/dL   RDW 30.814.8 (H) 65.711.5 - 84.614.5 %  Platelets 439 150 - 440 K/uL   Neutrophils Relative % 65 %   Neutro Abs 6.7 (H) 1.4 - 6.5 K/uL   Lymphocytes Relative 25 %   Lymphs Abs 2.7 1.0 - 3.6 K/uL   Monocytes Relative 9 %   Monocytes Absolute 1.0 (H) 0.2 - 0.9 K/uL   Eosinophils Relative 0 %   Eosinophils Absolute 0.0 0 - 0.7 K/uL   Basophils Relative 1 %   Basophils Absolute 0.1 0 - 0.1 K/uL  RPR     Status: None   Collection Time: 01/16/18  4:26 PM  Result Value Ref Range   RPR Ser Ql Non Reactive Non Reactive  Type and screen [facility]     Status: None   Collection Time: 01/16/18  4:26 PM  Result Value Ref Range   ABO/RH(D) O POS    Antibody Screen NEG    Sample Expiration      01/19/2018 Performed at [facility], [address] [place], [place] [address]   Urine Drug Screen, Qualitative ([facility] only)     Status: Abnormal   Collection Time: 01/16/18  7:27 PM  Result Value Ref Range   Tricyclic, Ur  Screen NONE DETECTED NONE DETECTED   Amphetamines, Ur Screen NONE DETECTED NONE DETECTED   MDMA (Ecstasy)Ur Screen NONE DETECTED NONE DETECTED   Cocaine Metabolite,Ur [place] NONE DETECTED NONE DETECTED   Opiate, Ur Screen NONE DETECTED NONE DETECTED   Phencyclidine (PCP) Ur S NONE DETECTED NONE DETECTED   Cannabinoid 50 Ng, Ur [place] POSITIVE (A) NONE DETECTED   Barbiturates, Ur Screen (A) NONE DETECTED    Result not available. Reagent lot number recalled by manufacturer.   Benzodiazepine, Ur Scrn NONE DETECTED NONE DETECTED   Methadone Scn, Ur NONE DETECTED NONE DETECTED  CBC     Status: Abnormal   Collection Time: 01/17/18  8:59 AM  Result Value Ref Range   WBC 9.8 3.6 - 11.0 K/uL   RBC 4.61 3.80 - 5.20 MIL/uL   Hemoglobin 12.3 12.0 - 16.0 g/dL   HCT 60.4 54.0 - 98.1 %   MCV 78.2 (L) 80.0 - 100.0 fL   MCH 26.6 26.0 - 34.0 pg   MCHC 34.0 32.0 - 36.0 g/dL   RDW 19.1 (H) 47.8 - 29.5 %   Platelets 396 150 - 440 K/uL  Differential     Status: None   Collection Time: 01/17/18  8:59 AM  Result Value Ref Range   Neutrophils Relative % 64 %   Neutro Abs 6.3 1.4 - 6.5 K/uL   Lymphocytes Relative 27 %   Lymphs Abs 2.6 1.0 - 3.6 K/uL   Monocytes Relative 8 %   Monocytes Absolute 0.8 0.2 - 0.9 K/uL   Eosinophils Relative 0 %   Eosinophils Absolute 0.0 0 - 0.7 K/uL   Basophils Relative 1 %   Basophils Absolute 0.1 0 - 0.1 K/uL  Basic metabolic panel     Status: Abnormal   Collection Time: 01/17/18  8:59 AM  Result Value Ref Range   Sodium 138 135 - 145 mmol/L   Potassium 2.8 (L) 3.5 - 5.1 mmol/L   Chloride 100 98 - 111 mmol/L   CO2 28 22 - 32 mmol/L   Glucose, Bld 84 70 - 99 mg/dL   BUN 7 6 - 20 mg/dL   Creatinine, Ser 6.21 0.44 - 1.00 mg/dL   Calcium 8.7 (L) 8.9 - 10.3 mg/dL   GFR calc non Af Amer >60 >60 mL/min  GFR calc Af Amer >60 >60 mL/min   Anion gap 10 5 - 15  Hepatic function panel     Status: Abnormal   Collection Time: 01/17/18  8:59 AM  Result Value Ref Range   Total  Protein 5.8 (L) 6.5 - 8.1 g/dL   Albumin 2.6 (L) 3.5 - 5.0 g/dL   AST 72 (H) 15 - 41 U/L   ALT 76 (H) 0 - 44 U/L   Alkaline Phosphatase 242 (H) 38 - 126 U/L   Total Bilirubin 1.2 0.3 - 1.2 mg/dL   Bilirubin, Direct 0.4 (H) 0.0 - 0.2 mg/dL   Indirect Bilirubin 0.8 0.3 - 0.9 mg/dL  Magnesium     Status: None   Collection Time: 01/17/18  8:59 AM  Result Value Ref Range   Magnesium 2.0 1.7 - 2.4 mg/dL  Protein / creatinine ratio, urine     Status: None   Collection Time: 01/17/18 10:01 AM  Result Value Ref Range   Creatinine, Urine 125 mg/dL   Total Protein, Urine 14 mg/dL   Protein Creatinine Ratio 0.11 0.00 - 0.15 mg/mg[Cre]     Assessment  G2P0010 at [redacted]w[redacted]d Estimated Date of Delivery: 02/10/18  The fetus shows a category 2 fetal monitor strip Placental insufficiency Hypokalemia Persistent nausea and vomiting   Patient Active Problem List   Diagnosis Date Noted  . Hyperemesis affecting pregnancy, antepartum 01/13/2018  . Pregnancy 11/30/2017  . Labor and delivery indication for care or intervention 11/27/2017    Plan  1. Admit to L&D :    2. EFM: -- Category 2  See MFM consult Dr. Fayrene Fearing.  Discussed risks and benefits of labor versus elective cesarean delivery with the patient.  Dr. Fayrene Fearing feels like there is a low likelihood of the baby tolerating a prolonged labor by induction because of the current category 2 strip and low AFI as well as the appearance of the placenta.  I have discussed the risks and benefits of vaginal birth versus cesarean birth as well as the possibility of labor with subsequent cesarean delivery late in the process. The patient has asked appropriate questions and discussed this matter with her family and they have all decided that cesarean delivery is the choice that they have made for delivery. Will improve hypokalemia with IV potassium and once normalized with elective primary cesarean delivery. Continuous fetal monitoring until that time.  Elonda Husky, M.D. 01/17/2018 12:26 PM

## 2018-01-17 NOTE — Anesthesia Procedure Notes (Signed)
Spinal  Patient location during procedure: OR Staffing Anesthesiologist: Kephart, William K, MD Resident/CRNA: Scheryl Sanborn, CRNA Performed: resident/CRNA  Preanesthetic Checklist Completed: patient identified, site marked, surgical consent, pre-op evaluation, timeout performed, IV checked, risks and benefits discussed and monitors and equipment checked Spinal Block Patient position: sitting Prep: ChloraPrep and site prepped and draped Patient monitoring: heart rate, continuous pulse ox, blood pressure and cardiac monitor Approach: midline Location: L4-5 Injection technique: single-shot Needle Needle type: Whitacre and Introducer  Needle gauge: 24 G Needle length: 9 cm Additional Notes Negative paresthesia. Negative blood return. Positive free-flowing CSF. Expiration date of kit checked and confirmed. Patient tolerated procedure well, without complications.       

## 2018-01-17 NOTE — Transfer of Care (Signed)
Immediate Anesthesia Transfer of Care Note  Patient: Cindy Gibson  Procedure(s) Performed: CESAREAN SECTION (N/A Abdomen)  Patient Location: PACU  Anesthesia Type:Spinal  Level of Consciousness: awake, alert  and oriented  Airway & Oxygen Therapy: Patient Spontanous Breathing  Post-op Assessment: Report given to RN and Post -op Vital signs reviewed and stable  Post vital signs: Reviewed  Last Vitals:  Vitals Value Taken Time  BP 144/89 01/17/2018 10:45 PM  Temp 36.4 C 01/17/2018 10:45 PM  Pulse 89 01/17/2018 10:45 PM  Resp 16 01/17/2018 10:45 PM  SpO2 99 % 01/17/2018 10:45 PM    Last Pain:  Vitals:   01/17/18 2103  TempSrc: Oral  PainSc:          Complications: No apparent anesthesia complications

## 2018-01-17 NOTE — Progress Notes (Signed)
Pt sitting forward with coughing/dry heaving, maternal HR monitor initiated. It appears that efm is tracing maternal HR when Pt sits forward to cough. Dr Logan BoresEvans notified and EFM tracing reviewed.

## 2018-01-18 ENCOUNTER — Encounter: Payer: Self-pay | Admitting: Obstetrics and Gynecology

## 2018-01-18 LAB — BASIC METABOLIC PANEL
Anion gap: 5 (ref 5–15)
BUN: 6 mg/dL (ref 6–20)
CO2: 31 mmol/L (ref 22–32)
Calcium: 8.4 mg/dL — ABNORMAL LOW (ref 8.9–10.3)
Chloride: 103 mmol/L (ref 98–111)
Creatinine, Ser: 0.68 mg/dL (ref 0.44–1.00)
GFR calc Af Amer: >60 mL/min (ref >60)
GFR calc non Af Amer: >60 mL/min (ref >60)
Glucose, Bld: 123 mg/dL — ABNORMAL HIGH (ref 70–99)
Potassium: 3.5 mmol/L (ref 3.5–5.1)
Sodium: 139 mmol/L (ref 135–145)

## 2018-01-18 LAB — RPR: RPR Ser Ql: NONREACTIVE

## 2018-01-18 LAB — HEPATITIS C ANTIBODY: HCV Ab: <0.1 s/co ratio (ref 0.0–0.9)

## 2018-01-18 LAB — RUBELLA SCREEN: Rubella: 4.72 index (ref >0.99)

## 2018-01-18 LAB — HIV ANTIBODY (ROUTINE TESTING W REFLEX): HIV Screen 4th Generation wRfx: NONREACTIVE

## 2018-01-18 LAB — HEPATITIS B SURFACE ANTIGEN: Hepatitis B Surface Ag: NEGATIVE

## 2018-01-18 NOTE — Progress Notes (Signed)
Patient ID: Philis FendtJazmine Cindy Gibson, female   DOB: May 25, 1991, 27 y.o.   MRN: [id number]    Progress Note - Cesarean Delivery  Cindy Karlyn AgeeSheria Gibson is a 27 y.o. G2P0010 now PP day 1 s/p C-Section, Low Transverse .   Subjective:  Patient reports no problems with eating, bowel movements, voiding, or their wound - no N/V.    Objective:  Vital signs in last 24 hours: Temp:  [97.5 F (36.4 C)-98.8 F (37.1 C)] 98.3 F (36.8 C) (07/19 0821) Pulse Rate:  [63-110] 63 (07/19 0821) Resp:  [13-19] 18 (07/19 0821) BP: (103-144)/(64-98) 134/93 (07/19 0821) SpO2:  [97 %-100 %] 98 % (07/19 [id number])  Physical Exam:  General: cooperative, appears stated age and no distress Lochia: appropriate Uterine Fundus: firm Incision: Dressing intact DVT Evaluation: No evidence of DVT seen on physical exam.    Data Review Recent Labs    01/16/18 1626 01/17/18 0859  HGB 13.6 12.3  HCT 40.6 36.0    Assessment:  Active Problems:   Placental insufficiency   Status post Cesarean section. Doing well postoperatively.     Plan:       Continue current care.    Elonda Huskyavid J. Lacee Grey, M.D. 01/18/2018 8:43 AM

## 2018-01-18 NOTE — Anesthesia Postprocedure Evaluation (Signed)
Anesthesia Post Note  Patient: Cindy Gibson  Procedure(s) Performed: CESAREAN SECTION (N/A Abdomen)  Patient location during evaluation: Mother Baby Anesthesia Type: Spinal Level of consciousness: awake and alert and oriented Pain management: pain level controlled Vital Signs Assessment: post-procedure vital signs reviewed and stable Respiratory status: spontaneous breathing Cardiovascular status: stable Postop Assessment: no backache, no apparent nausea or vomiting, patient able to bend at knees, able to ambulate and adequate PO intake Anesthetic complications: no     Last Vitals:  Vitals:   01/18/18 0322 01/18/18 0435  BP: 121/78 121/80  Pulse: 76 74  Resp: 18 18  Temp: 36.7 C 36.8 C  SpO2: 99% 100%    Last Pain:  Vitals:   01/18/18 0550  TempSrc:   PainSc: 5                  Zachary GeorgeWeatherly,  Steven Veazie F

## 2018-01-18 NOTE — Progress Notes (Addendum)
[facility] - [place] [facility]   PATIENT NAME: Cindy Gibson    MR#:  [id number]  DATE OF BIRTH:  Jul 04, 1990  SUBJECTIVE:  patient feeling better. She just came back from the nursery after seeing her baby. No nausea vomiting. She underwent C-section last night ambulating in the hallways eating well.  REVIEW OF SYSTEMS:   Review of Systems  Constitutional: Negative for chills, fever and weight loss.  HENT: Negative for ear discharge, ear pain and nosebleeds.   Eyes: Negative for blurred vision, pain and discharge.  Respiratory: Negative for sputum production, shortness of breath, wheezing and stridor.   Cardiovascular: Negative for chest pain, palpitations, orthopnea and PND.  Gastrointestinal: Negative for abdominal pain, diarrhea, nausea and vomiting.  Genitourinary: Negative for frequency and urgency.  Musculoskeletal: Negative for back pain and joint pain.  Neurological: Negative for sensory change, speech change, focal weakness and weakness.  Psychiatric/Behavioral: Negative for depression and hallucinations. The patient is not nervous/anxious.    Tolerating Diet:yes Tolerating PT: ambulatory  DRUG ALLERGIES:  No Known Allergies  VITALS:  Blood pressure (!) 134/93, pulse 63, temperature 98.3 F (36.8 C), temperature source Oral, resp. rate 18, height 5\' 1"  (1.549 m), weight 75.3 kg (166 lb), SpO2 98 %.  PHYSICAL EXAMINATION:   Physical Exam  GENERAL:  27 y.o.-year-old patient lying in the bed with no acute distress.  EYES: Pupils equal, round, reactive to light and accommodation. No scleral icterus. Extraocular muscles intact.  HEENT: Head atraumatic, normocephalic. Oropharynx and nasopharynx clear.  NECK:  Supple, no jugular venous distention. No thyroid enlargement, no tenderness.  LUNGS: Normal breath sounds bilaterally, no wheezing, rales, rhonchi. No use of accessory muscles of respiration.  CARDIOVASCULAR: S1, S2 normal. No murmurs,  rubs, or gallops.  ABDOMEN: Soft, nontender, nondistended. Bowel sounds present. No organomegaly or mass. Section incision looking okay EXTREMITIES: No cyanosis, clubbing or edema b/l.    NEUROLOGIC: Cranial nerves II through XII are intact. No focal Motor or sensory deficits b/l.   PSYCHIATRIC:  patient is alert and oriented x 3.  SKIN: No obvious rash, lesion, or ulcer.   LABORATORY PANEL:  CBC Recent Labs  Lab 01/17/18 0859  WBC 9.8  HGB 12.3  HCT 36.0  PLT 396    Chemistries  Recent Labs  Lab 01/17/18 0859  01/18/18 0524  NA 138  --  139  K 2.8*   < > 3.5  CL 100  --  103  CO2 28  --  31  GLUCOSE 84  --  123*  BUN 7  --  6  CREATININE 0.66  --  0.68  CALCIUM 8.7*  --  8.4*  MG 2.0  --   --   AST 72*  --   --   ALT 76*  --   --   ALKPHOS 242*  --   --   BILITOT 1.2  --   --    < > = values in this interval not displayed.   Cardiac Enzymes No results for input(s): TROPONINI in the last 168 hours. RADIOLOGY:  [place] Fetal Bpp W/nonstress  Result Date: 01/16/2018 CLINICAL DATA:  Pregnant, decreased fetal movement, nausea/vomiting EXAM: LIMITED OBSTETRIC ULTRASOUND AND BIOPHYSICAL PROFILE FINDINGS: Number of Fetuses: 1 Heart Rate:  149 bpm Movement: Yes Presentation: Cephalic Placental Location: Not assessed Previa: Not evaluated Amniotic Fluid (Subjective):  Decreased.  AFI 4.2 cm. MATERNAL FINDINGS: Cervix:  Not evaluated. Uterus/Adnexae: Not evaluated. Movement:  2  Time: 19 minutes  Breathing: 2 Tone:  2 Amniotic Fluid: 0 Total Score:  6 IMPRESSION: Single live intrauterine gestation, as above. Fetal heart rate 149 beats per minute. Biophysical profile score is 6 out of 8, secondary to oligohydramnios. This exam is performed on an emergent basis and does not comprehensively evaluate fetal size, dating, or anatomy; follow-up complete OB US should be considered if further fetal assessment is warranted. Electronically Signed   By: Charline Bills M.D.   On: 01/16/2018 17:46    ASSESSMENT AND PLAN:  Cindy Gibson  is a 27 y.o. female with a known history of nausea and vomiting with her pregnancy.  She states that she has had nausea on and off through the entire pregnancy.  She states that she is unable to keep anything down and she has been vomiting.  1.  Hypokalemia. Secondary to G.I. losses now corrected with IV and oral. Patient's potassium is 3.5.  -Baseline. Ambulating. She had good appetite and ate well this morning and afternoon.   2.  Persistent nausea vomiting and GERD likely related to the pregnancy-- resolved since a C-section.  PRN nausea medications and acid blockers.   3.  Pregnancy 36 weeks and 4 days--- and underwent C-section last night.  Overall improved. Internal medicine will sign off. Call if needed. Thank you for the consult.  Case discussed with Care Management/Social Worker. Management plans discussed with the patient, family and they are in agreement.  CODE STATUS: full  DVT Prophylaxis: ambulation  TOTAL TIME TAKING CARE OF THIS PATIENT: *25* minutes.  >50% time spent on counselling and coordination of care    Note: This dictation was prepared with Dragon dictation along with smaller phrase technology. Any transcriptional errors that result from this process are unintentional.  Enedina Finner M.D on 01/18/2018 at 1:27 PM  Between 7am to 6pm - Pager - [phone]  After 6pm go to [email or web address] - password [email or web address]  [facility]  Office  [phone]  CC: Primary care physician; Patient, No Pcp PerPatient ID: Cindy Gibson, female   DOB: Mar 15, 1991, 27 y.o.   MRN: [id number]

## 2018-01-18 NOTE — Progress Notes (Signed)
Pharmacy Electrolyte Monitoring Consult:  Pharmacy consulted to assist in monitoring and replacing electrolytes in this 27 y.o. female admitted on 01/16/2018 with Nausea and Emesis  hyperemesis d/t pregnancy  Labs:  Sodium (mmol/Gibson)  Date Value  01/18/2018 139  11/16/2012 139   Potassium (mmol/Gibson)  Date Value  01/18/2018 3.5  11/16/2012 4.1   Magnesium (mg/dL)  Date Value  09/81/191407/18/2019 2.0   Calcium (mg/dL)  Date Value  78/29/562107/19/2019 8.4 (Gibson)   Calcium, Total (mg/dL)  Date Value  30/86/578405/17/2014 8.6 (Gibson)   Albumin (g/dL)  Date Value  69/62/952807/18/2019 2.6 (Gibson)  11/16/2012 4.1    Assessment/Plan: K 3.5. No additional supplementation at this time.F/u in am  Marty HeckWang, Cindy Gibson 01/18/2018 10:25 AM

## 2018-01-19 LAB — BASIC METABOLIC PANEL
Anion gap: 6 (ref 5–15)
BUN: 9 mg/dL (ref 6–20)
CO2: 28 mmol/L (ref 22–32)
Calcium: 7.8 mg/dL — ABNORMAL LOW (ref 8.9–10.3)
Chloride: 105 mmol/L (ref 98–111)
Creatinine, Ser: 0.59 mg/dL (ref 0.44–1.00)
GFR calc Af Amer: >60 mL/min (ref >60)
GFR calc non Af Amer: >60 mL/min (ref >60)
Glucose, Bld: 83 mg/dL (ref 70–99)
Potassium: 3.4 mmol/L — ABNORMAL LOW (ref 3.5–5.1)
Sodium: 139 mmol/L (ref 135–145)

## 2018-01-19 MED ORDER — POTASSIUM CHLORIDE CRYS ER 20 MEQ PO TBCR
20.0000 meq | EXTENDED_RELEASE_TABLET | Freq: Once | ORAL | Status: AC
  Administered 2018-01-19: 20 meq via ORAL
  Filled 2018-01-19: qty 1

## 2018-01-19 NOTE — Progress Notes (Signed)
Postpartum Day # 2: Cesarean Delivery  Subjective: Patient reports tolerating PO and no problems voiding.  Denies nausea/vomiting. Tolerating diet. Ambulating and voiding without difficulty.   Objective: Vital signs in last 24 hours: Vitals:   01/18/18 0435 01/18/18 0821 01/18/18 1717 01/18/18 2328  BP: 121/80 (!) 134/93 133/90 139/85  Pulse: 74 63 78 87  Resp: 18 18 20 20   Temp: 98.2 F (36.8 C) 98.3 F (36.8 C) 98.7 F (37.1 C)   TempSrc: Axillary Oral Axillary   SpO2: 100% 98%  99%  Weight:      Height:        Physical Exam:  General: alert and no distress Lungs: clear to auscultation bilaterally Breasts: normal appearance, no masses or tenderness Heart: regular rate and rhythm, S1, S2 normal, no murmur, click, rub or gallop Abdomen: soft, non-tender; bowel sounds normal; no masses,  no organomegaly Pelvis: Lochia appropriate, Uterine Fundus firm, Incision: healing well, no significant drainage, no dehiscence, no significant erythema Extremities: DVT Evaluation: No evidence of DVT seen on physical exam. Negative Homan's sign. No cords or calf tenderness. No significant calf/ankle edema.  Recent Labs    01/16/18 1626 01/17/18 0859  HGB 13.6 12.3  HCT 40.6 36.0   Lab Results  Component Value Date   K 3.5 01/18/2018     Assessment/Plan: Status post primary Cesarean section. Doing well postoperatively.  Regular diet as tolerated Continue PO pain management Encourage ambulation Hypokalemia was corrected. Potassium now at 3.5 (up from 2.8) Continue current care.  Infant currently in special care nursery for birth weight.  Contraception: undecided.  Will give information at discharge regarding options.  Plan for discharge tomorrow    Cindy Gibson [facility]

## 2018-01-19 NOTE — Progress Notes (Signed)
Pharmacy Electrolyte Monitoring Consult:  Pharmacy consulted to assist in monitoring and replacing electrolytes in this 27 y.o. female admitted on 01/16/2018 with Nausea and Emesis  hyperemesis d/t pregnancy  Labs:  Sodium (mmol/L)  Date Value  01/19/2018 139  11/16/2012 139   Potassium (mmol/L)  Date Value  01/19/2018 3.4 (L)  11/16/2012 4.1   Magnesium (mg/dL)  Date Value  16/10/960407/18/2019 2.0   Calcium (mg/dL)  Date Value  54/09/811907/20/2019 7.8 (L)   Calcium, Total (mg/dL)  Date Value  14/78/295605/17/2014 8.6 (L)   Albumin (g/dL)  Date Value  21/30/865707/18/2019 2.6 (L)  11/16/2012 4.1    Assessment/Plan: KCL 20meq PO x 1.  F/U electrolytes with am labs.   Stormy CardKatsoudas,Michelina Mexicano K, [facility] 01/19/2018 8:44 AM

## 2018-01-20 ENCOUNTER — Encounter: Payer: Self-pay | Admitting: Lactation Services

## 2018-01-20 DIAGNOSIS — E876 Hypokalemia: Secondary | ICD-10-CM

## 2018-01-20 DIAGNOSIS — R0989 Other specified symptoms and signs involving the circulatory and respiratory systems: Secondary | ICD-10-CM

## 2018-01-20 LAB — BASIC METABOLIC PANEL
Anion gap: 7 (ref 5–15)
BUN: 9 mg/dL (ref 6–20)
CO2: 25 mmol/L (ref 22–32)
Calcium: 8.2 mg/dL — ABNORMAL LOW (ref 8.9–10.3)
Chloride: 106 mmol/L (ref 98–111)
Creatinine, Ser: 0.67 mg/dL (ref 0.44–1.00)
GFR calc Af Amer: >60 mL/min (ref >60)
GFR calc non Af Amer: >60 mL/min (ref >60)
Glucose, Bld: 95 mg/dL (ref 70–99)
Potassium: 3.6 mmol/L (ref 3.5–5.1)
Sodium: 138 mmol/L (ref 135–145)

## 2018-01-20 MED ORDER — OXYCODONE-ACETAMINOPHEN 5-325 MG PO TABS: 1.0000 | ORAL_TABLET | Freq: Four times a day (QID) | ORAL | 0 refills | Status: DC | PRN

## 2018-01-20 MED ORDER — IBUPROFEN 600 MG PO TABS: 600.0000 mg | ORAL_TABLET | Freq: Four times a day (QID) | ORAL | 1 refills | Status: DC | PRN

## 2018-01-20 NOTE — Progress Notes (Signed)
Pharmacy Electrolyte Monitoring Consult:  Pharmacy consulted to assist in monitoring and replacing electrolytes in this 27 y.o. female admitted on 01/16/2018 with Nausea and Emesis  hyperemesis d/t pregnancy  Labs:  Sodium (mmol/L)  Date Value  01/20/2018 138  11/16/2012 139   Potassium (mmol/L)  Date Value  01/20/2018 3.6  11/16/2012 4.1   Magnesium (mg/dL)  Date Value  16/10/960407/18/2019 2.0   Calcium (mg/dL)  Date Value  54/09/811907/21/2019 8.2 (L)   Calcium, Total (mg/dL)  Date Value  14/78/295605/17/2014 8.6 (L)   Albumin (g/dL)  Date Value  21/30/865707/18/2019 2.6 (L)  11/16/2012 4.1    Assessment/Plan: No supplementation is warranted at this time.  F/U electrolytes with am labs.   Stormy CardKatsoudas,Maksym Pfiffner K, [facility] 01/20/2018 9:10 AM

## 2018-01-20 NOTE — Progress Notes (Signed)
Discussed plan of care with Dr. Valentino Saxonherry.  MD states that patient may be discharge do to home, and order for BMP on 01/21/18 will be discontinued. Reynold BowenSusan Paisley Margel Joens, RN 01/20/2018 12:03 PM

## 2018-01-20 NOTE — Progress Notes (Signed)
Patient states that she received TDaP vaccine during prenatal care.

## 2018-01-20 NOTE — Discharge Instructions (Signed)
Breast Pumping Tips °If you are breastfeeding, there may be times when you cannot feed your baby directly. Returning to work or going on a trip are common examples. Pumping allows you to store breast milk and feed it to your baby later. °You may not get much milk when you first start to pump. Your breasts should start to make more after a few days. If you pump at the times you usually feed your baby, you may be able to keep making enough milk to feed your baby without also using formula. The more often you pump, the more milk you will produce. °When should I pump? °· You can begin to pump soon after delivery. However, some experts recommend waiting about 4 weeks before giving your infant a bottle to make sure breastfeeding is going well. °· If you plan to return to work, begin pumping a few weeks before. This will help you develop techniques that work best for you. It also lets you build up a supply of breast milk. °· When you are with your infant, feed on demand and pump after each feeding. °· When you are away from your infant for several hours, pump for about 15 minutes every 2-3 hours. Pump both breasts at the same time if you can. °· If your infant has a formula feeding, make sure to pump around the same time. °· If you drink any alcohol, wait 2 hours before pumping. °How do I prepare to pump? °Your let-down reflex is the natural reaction to stimulation that makes your breast milk flow. It is easier to stimulate this reflex when you are relaxed. Find relaxation techniques that work for you. If you have difficulty with your let-down reflex, try these methods: °· Smell one of your infant's blankets or an item of clothing. °· Look at a picture or video of your infant. °· Sit in a quiet, private space. °· Massage the breast you plan to pump. °· Place soothing warmth on the breast. °· Play relaxing music. ° °What are some general breast pumping tips? °· Wash your hands before you pump. You do not need to wash your  nipples or breasts. °· There are three ways to pump. °? You can use your hand to massage and compress your breast. °? You can use a handheld manual pump. °? You can use an electric pump. °· Make sure the suction cup (flange) on the breast pump is the right size. Place the flange directly over the nipple. If it is the wrong size or placed the wrong way, it may be painful and cause nipple damage. °· If pumping is uncomfortable, apply a small amount of purified or modified lanolin to your nipple and areola. °· If you are using an electric pump, adjust the speed and suction power to be more comfortable. °· If pumping is painful or if you are not getting very much milk, you may need a different type of pump. A lactation consultant can help you determine what type of pump to use. °· Keep a full water bottle near you at all times. Drinking lots of fluid helps you make more milk. °· You can store your milk to use later. Pumped breast milk can be stored in a sealable, sterile container or plastic bag. Label all stored breast milk with the date you pumped it. °? Milk can stay out at room temperature for up to 8 hours. °? You can store your milk in the refrigerator for up to 8 days. °? You can   store your milk in the freezer for 3 months. Thaw frozen milk using warm water. Do not put it in the microwave. °· Do not smoke. Smoking can lower your milk supply and harm your infant. If you need help quitting, ask your health care provider to recommend a program. °When should I call my health care provider or a lactation consultant? °· You are having trouble pumping. °· You are concerned that you are not making enough milk. °· You have nipple pain, soreness, or redness. °· You want to use birth control. Birth control pills may lower your milk supply. Talk to your health care provider about your options. °This information is not intended to replace advice given to you by your health care provider. Make sure you discuss any questions  you have with your health care provider. °Document Released: 12/07/2009 Document Revised: 12/01/2015 Document Reviewed: 04/11/2013 °Elsevier Interactive Patient Education © 2017 [employer] Inc. ° °

## 2018-01-20 NOTE — Discharge Summary (Signed)
OB Discharge Summary     Patient Name: Cindy Gibson DOB: Apr 07, 1991 MRN: [id number]  Date of admission: 01/16/2018 Delivering MD: Linzie Collin   Date of discharge: 01/20/2018  Admitting diagnosis: vomiting 36 wks preg Intrauterine pregnancy: [redacted]w[redacted]d     Secondary diagnosis:  Active Problems:   Placental insufficiency   Hypokalemia   Labile blood pressure  Additional problems: Non-reassuring fetal tracing at admission     Discharge diagnosis: Preterm Pregnancy Delivered, Postpartum HTN                                                                                               Post partum procedures:None  Augmentation: None  Complications: None  Hospital course:  Sceduled C/S   27 y.o. yo G2P0010 at [redacted]w[redacted]d was admitted to the hospital 01/16/2018 due to non-reassuring fetal tracing and persistent nausea and vomiting.  She had an ultrasound performed which noted placental insufficiency and low AFI.  She was also found to be hypokalemic with a potassium of 2.8. Maternal Fetal Medicine was consulted, and patient was recommended to undergo a primary C-section.  The patient's potassium was repleted prior to undergoing a C-section  Patient delivered a Viable infant.01/17/2018  Details of operation can be found in separate operative note.  Pateint had an uncomplicated postpartum course.  She is ambulating, tolerating a regular diet, passing flatus, and urinating well. Patient is discharged home in stable condition on  01/20/2018. She was noted to have labile BPs, possibly some postartum hypertension.  No medications were initiated.           Physical exam  Vitals:   01/20/18 0012 01/20/18 0342 01/20/18 0749 01/20/18 1134  BP: (!) 144/88 (!) 141/93 (!) 135/97 133/80  Pulse: 81 77 78 88  Resp: 18 18 20 18   Temp:  98.5 F (36.9 C) 98.2 F (36.8 C) 98.3 F (36.8 C)  TempSrc:  Oral Oral Oral  SpO2: 100% 100% 100% 100%  Weight:      Height:       General: alert and no  distress Lochia: appropriate Uterine Fundus: firm Incision: Healing well with no significant drainage, No significant erythema, Dressing is clean, dry, and intact DVT Evaluation: No evidence of DVT seen on physical exam. Negative Homan's sign. No cords or calf tenderness. No significant calf/ankle edema. Labs: Lab Results  Component Value Date   WBC 9.8 01/17/2018   HGB 12.3 01/17/2018   HCT 36.0 01/17/2018   MCV 78.2 (L) 01/17/2018   PLT 396 01/17/2018   CMP Latest Ref Rng & Units 01/20/2018  Glucose 70 - 99 mg/dL 95  BUN 6 - 20 mg/dL 9  Creatinine 9.52 - 8.41 mg/dL 3.24  Sodium 401 - 027 mmol/L 138  Potassium 3.5 - 5.1 mmol/L 3.6  Chloride 98 - 111 mmol/L 106  CO2 22 - 32 mmol/L 25  Calcium 8.9 - 10.3 mg/dL 8.2(L)  Total Protein 6.5 - 8.1 g/dL -  Total Bilirubin 0.3 - 1.2 mg/dL -  Alkaline Phos 38 - 253 U/L -  AST 15 - 41 U/L -  ALT 0 - 44  U/L -    Discharge instruction: per After Visit Summary and "Baby and Me Booklet".  After visit meds:  Allergies as of 01/20/2018   No Known Allergies     Medication List    STOP taking these medications   pantoprazole 20 MG tablet Commonly known as:  PROTONIX   promethazine 25 MG suppository Commonly known as:  PHENERGAN     TAKE these medications   ibuprofen 600 MG tablet Commonly known as:  ADVIL,MOTRIN Take 1 tablet (600 mg total) by mouth every 6 (six) hours as needed.   multivitamin-prenatal 27-0.8 MG Tabs tablet Take 1 tablet by mouth daily at 12 noon.   oxyCODONE-acetaminophen 5-325 MG tablet Commonly known as:  PERCOCET/ROXICET Take 1-2 tablets by mouth every 6 (six) hours as needed (pain scale > 7).       Diet: routine diet  Activity: Advance as tolerated. Pelvic rest for 6 weeks.   Outpatient follow up:1 week for incision check and BP check Follow up Appt: No future appointments. Follow up Visit:No follow-ups on file.  Postpartum contraception: Undecided  Newborn Data: Live born female  Birth  Weight: 4 lb 5.1 oz (1960 g) APGAR: 8, 9  Newborn Delivery   Birth date/time:  01/17/2018 21:58:00 Delivery type:  C-Section, Low Transverse C-section categorization:  Primary     Baby Feeding: Bottle Disposition:home with mother   01/20/2018 Hildred LaserAnika Arius Harnois, MD  [facility]

## 2018-01-20 NOTE — Progress Notes (Signed)
Postpartum Day # 3: Cesarean Delivery  Subjective: Patient reports tolerating PO and no problems voiding.  Denies nausea/vomiting. Tolerating diet. Ambulating and voiding without difficulty.   Objective: Vital signs in last 24 hours: Vitals:   01/20/18 0012 01/20/18 0342 01/20/18 0749 01/20/18 1134  BP: (!) 144/88 (!) 141/93 (!) 135/97 133/80  Pulse: 81 77 78 88  Resp: 18 18 20 18   Temp:  98.5 F (36.9 C) 98.2 F (36.8 C) 98.3 F (36.8 C)  TempSrc:  Oral Oral Oral  SpO2: 100% 100% 100% 100%  Weight:      Height:        Physical Exam:  General: alert and no distress Lungs: clear to auscultation bilaterally Breasts: normal appearance, no masses or tenderness Heart: regular rate and rhythm, S1, S2 normal, no murmur, click, rub or gallop Abdomen: soft, non-tender; bowel sounds normal; no masses,  no organomegaly Pelvis: Lochia appropriate, Uterine Fundus firm, Incision: healing well, no significant drainage, no dehiscence, no significant erythema Extremities: DVT Evaluation: No evidence of DVT seen on physical exam. Negative Homan's sign. No cords or calf tenderness. No significant calf/ankle edema.  No results for input(s): HGB, HCT in the last 72 hours. Lab Results  Component Value Date   K 3.6 01/20/2018     Assessment/Plan: Status post primary Cesarean section. Doing well postoperatively.  Regular diet Continue PO pain management Encourage ambulation Hypokalemia was corrected. Potassium now at 3.6 (up from 2.8) Continue current care.  Infant currently in special care nursery for birth weight. May be discharged by Tuesday per Pediatrician. Contraception: undecided.  Will give information at discharge regarding options.  Plan for discharge today.  Will have patient f/u in 4-5 days for BP check as she is having labile BPs postpartum. No prior h/o PIH in pregnancy.     Hildred LaserAnika Jiovani Mccammon, MD [facility]

## 2018-01-20 NOTE — Progress Notes (Signed)
Discharge instructions provided.  Pt and sig other verbalize understanding of all instructions and follow-up care.  Pt discharged to home at 1405 on 01/20/18 via wheelchair by RN. Reynold BowenSusan Paisley Shantee Hayne, RN 01/20/2018 4:29 PM

## 2018-01-25 ENCOUNTER — Encounter: Payer: Self-pay | Admitting: Obstetrics and Gynecology

## 2018-01-25 ENCOUNTER — Ambulatory Visit (INDEPENDENT_AMBULATORY_CARE_PROVIDER_SITE_OTHER): Payer: PRIVATE HEALTH INSURANCE | Admitting: Obstetrics and Gynecology

## 2018-01-25 VITALS — BP 126/86 | HR 83 | Wt 161.0 lb

## 2018-01-25 DIAGNOSIS — Z9889 Other specified postprocedural states: Secondary | ICD-10-CM

## 2018-01-25 NOTE — Progress Notes (Signed)
HPI:      [name] is a 27 y.o. G2P0010 who LMP was No LMP recorded.  Subjective:   She presents today 1 week after cesarean delivery.  She reports she is doing well.  She is not having pain.  She is currently bottlefeeding.  She states the infant is doing well.    Hx: The following portions of the patient's history were reviewed and updated as appropriate:             She  has a past medical history of GERD (gastroesophageal reflux disease) and Medical history non-contributory. She does not have any pertinent problems on file. She  has a past surgical history that includes Dilation and curettage of uterus and Cesarean section (N/A, 01/17/2018). Her family history includes CAD in her maternal grandfather; Diabetes in her maternal grandmother and mother; Rheum arthritis in her maternal grandmother and mother. She  reports that she has never smoked. She has never used smokeless tobacco. She reports that she does not drink alcohol or use drugs. She has a current medication list which includes the following prescription(s): ibuprofen, oxycodone-acetaminophen, and multivitamin-prenatal. She has No Known Allergies.       Review of Systems:  Review of Systems  Constitutional: Denied constitutional symptoms, night sweats, recent illness, fatigue, fever, insomnia and weight loss.  Eyes: Denied eye symptoms, eye pain, photophobia, vision change and visual disturbance.  Ears/Nose/Throat/Neck: Denied ear, nose, throat or neck symptoms, hearing loss, nasal discharge, sinus congestion and sore throat.  Cardiovascular: Denied cardiovascular symptoms, arrhythmia, chest pain/pressure, edema, exercise intolerance, orthopnea and palpitations.  Respiratory: Denied pulmonary symptoms, asthma, pleuritic pain, productive sputum, cough, dyspnea and wheezing.  Gastrointestinal: Denied, gastro-esophageal reflux, melena, nausea and vomiting.  Genitourinary: Denied genitourinary symptoms including  symptomatic vaginal discharge, pelvic relaxation issues, and urinary complaints.  Musculoskeletal: Denied musculoskeletal symptoms, stiffness, swelling, muscle weakness and myalgia.  Dermatologic: Denied dermatology symptoms, rash and scar.  Neurologic: Denied neurology symptoms, dizziness, headache, neck pain and syncope.  Psychiatric: Denied psychiatric symptoms, anxiety and depression.  Endocrine: Denied endocrine symptoms including hot flashes and night sweats.   Meds:   Current Outpatient Medications on File Prior to Visit  Medication Sig Dispense Refill  . ibuprofen (ADVIL,MOTRIN) 600 MG tablet Take 1 tablet (600 mg total) by mouth every 6 (six) hours as needed. 60 tablet 1  . oxyCODONE-acetaminophen (PERCOCET/ROXICET) 5-325 MG tablet Take 1-2 tablets by mouth every 6 (six) hours as needed (pain scale > 7). 20 tablet 0  . Prenatal Vit-Fe Fumarate-FA (MULTIVITAMIN-PRENATAL) 27-0.8 MG TABS tablet Take 1 tablet by mouth daily at 12 noon.     No current facility-administered medications on file prior to visit.     Objective:     Vitals:   01/25/18 0937  BP: 126/86  Pulse: 83               Abdomen: Soft.  Non-tender.  No masses.  No HSM.  Incision/s: Intact.  Healing well.  No erythema.  No drainage.  Small amount of edema/possible seroma under the right side of incision.  No erythema!.  Nontender.      Assessment:    G2P0010 Patient Active Problem List   Diagnosis Date Noted  . Hypokalemia 01/20/2018  . Labile blood pressure 01/20/2018  . 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 Hyperemesis affecting pregnancy, antepartum 01/13/2018  . Pregnancy 11/30/2017  . Labor and delivery indication for care or intervention 11/27/2017     1. Post-operative state  Patient doing well.   Plan:            1.  Discussed possibility of seroma and drainage versus delayed absorption.  No evidence of infection.  Wound care discussed in detail. Orders No orders of the defined  types were placed in this encounter.   No orders of the defined types were placed in this encounter.     F/U  Return in about 5 weeks (around 03/01/2018).  Elonda Huskyavid J. Itzayanna Kaster, M.D. 01/25/2018 9:41 AM

## 2018-01-25 NOTE — Progress Notes (Signed)
Pt states she has a lumpy place on her incision area. Pt also says she has been constipated since she came home from the hospital.

## 2018-02-27 ENCOUNTER — Encounter: Payer: PRIVATE HEALTH INSURANCE | Admitting: Obstetrics and Gynecology

## 2018-02-27 ENCOUNTER — Encounter: Payer: Self-pay | Admitting: Obstetrics and Gynecology

## 2018-02-27 ENCOUNTER — Ambulatory Visit (INDEPENDENT_AMBULATORY_CARE_PROVIDER_SITE_OTHER): Payer: PRIVATE HEALTH INSURANCE | Admitting: Obstetrics and Gynecology

## 2018-02-27 VITALS — BP 107/74 | HR 80 | Ht 61.0 in | Wt 160.0 lb

## 2018-02-27 DIAGNOSIS — Z9889 Other specified postprocedural states: Secondary | ICD-10-CM

## 2018-02-27 NOTE — Progress Notes (Signed)
HPI:      Cindy Gibson is a 27 y.o. G2P0010 who LMP was No LMP recorded.  Subjective:   She presents today approximately 6 weeks postpartum from cesarean delivery.  She has resumed normal activities and feels well.  She has occasional hip pain bilaterally but not significant.  She states that it is helped with ibuprofen.  She reports that she is bottlefeeding.  She had a menstrual period approximately 2 weeks ago. She desires IUD for birth control (previously had Mirena)    Hx: The following portions of the patient's history were reviewed and updated as appropriate:             She  has a past medical history of GERD (gastroesophageal reflux disease) and Medical history non-contributory. She does not have any pertinent problems on file. She  has a past surgical history that includes Dilation and curettage of uterus and Cesarean section (N/A, 01/17/2018). Her family history includes CAD in her maternal grandfather; Diabetes in her maternal grandmother and mother; Rheum arthritis in her maternal grandmother and mother. She  reports that she has never smoked. She has never used smokeless tobacco. She reports that she does not drink alcohol or use drugs. She has a current medication list which includes the following prescription(s): ibuprofen, oxycodone-acetaminophen, and multivitamin-prenatal. She has No Known Allergies.       Review of Systems:  Review of Systems  Constitutional: Denied constitutional symptoms, night sweats, recent illness, fatigue, fever, insomnia and weight loss.  Eyes: Denied eye symptoms, eye pain, photophobia, vision change and visual disturbance.  Ears/Nose/Throat/Neck: Denied ear, nose, throat or neck symptoms, hearing loss, nasal discharge, sinus congestion and sore throat.  Cardiovascular: Denied cardiovascular symptoms, arrhythmia, chest pain/pressure, edema, exercise intolerance, orthopnea and palpitations.  Respiratory: Denied pulmonary symptoms, asthma,  pleuritic pain, productive sputum, cough, dyspnea and wheezing.  Gastrointestinal: Denied, gastro-esophageal reflux, melena, nausea and vomiting.  Genitourinary: Denied genitourinary symptoms including symptomatic vaginal discharge, pelvic relaxation issues, and urinary complaints.  Musculoskeletal: Denied musculoskeletal symptoms, stiffness, swelling, muscle weakness and myalgia.  Dermatologic: Denied dermatology symptoms, rash and scar.  Neurologic: Denied neurology symptoms, dizziness, headache, neck pain and syncope.  Psychiatric: Denied psychiatric symptoms, anxiety and depression.  Endocrine: Denied endocrine symptoms including hot flashes and night sweats.   Meds:   Current Outpatient Medications on File Prior to Visit  Medication Sig Dispense Refill  . ibuprofen (ADVIL,MOTRIN) 600 MG tablet Take 1 tablet (600 mg total) by mouth every 6 (six) hours as needed. 60 tablet 1  . oxyCODONE-acetaminophen (PERCOCET/ROXICET) 5-325 MG tablet Take 1-2 tablets by mouth every 6 (six) hours as needed (pain scale > 7). 20 tablet 0  . Prenatal Vit-Fe Fumarate-FA (MULTIVITAMIN-PRENATAL) 27-0.8 MG TABS tablet Take 1 tablet by mouth daily at 12 noon.     No current facility-administered medications on file prior to visit.     Objective:     Vitals:   02/27/18 1404  BP: 107/74  Pulse: 80               Abdomen: Soft.  Non-tender.  No masses.  No HSM.  Incision/s: Intact.  Healing well.  No erythema.  No drainage.      Assessment:    G2P0010 Patient Active Problem List   Diagnosis Date Noted  . Hypokalemia 01/20/2018  . Labile blood pressure 01/20/2018  . 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 Hyperemesis affecting pregnancy, antepartum 01/13/2018  . Pregnancy 11/30/2017  . Labor and delivery indication for care  or intervention 11/27/2017     1. Post-operative state   2. Postpartum care and examination immediately after delivery     Patient doing very well postop.  Desires birth  control.   Plan:            1.  Patient may resume normal activities with exception of heavy lifting.  2.  Patient advised on birth control prior to receiving her IUD.  She has decided upon the Mirena and will return with her menses. Orders No orders of the defined types were placed in this encounter.   No orders of the defined types were placed in this encounter.     F/U  Return in about 3 weeks (around 03/20/2018) for She is to call at the start of next menses.  Elonda Huskyavid J. Jozalynn Noyce, M.D. 02/27/2018 2:40 PM

## 2018-02-27 NOTE — Progress Notes (Signed)
Pt presents today for PPV. Pt states both hips are constantly very sore. Pt has had a period in August but is unsure of exact date.  Pt would like to discuss IUD, Mirena or Kyleena.

## 2018-03-19 ENCOUNTER — Encounter: Payer: Self-pay | Admitting: Obstetrics and Gynecology

## 2018-03-19 ENCOUNTER — Ambulatory Visit (INDEPENDENT_AMBULATORY_CARE_PROVIDER_SITE_OTHER): Payer: PRIVATE HEALTH INSURANCE | Admitting: Obstetrics and Gynecology

## 2018-03-19 VITALS — BP 110/67 | HR 84 | Ht 61.0 in | Wt 165.5 lb

## 2018-03-19 DIAGNOSIS — Z30430 Encounter for insertion of intrauterine contraceptive device: Secondary | ICD-10-CM | POA: Diagnosis not present

## 2018-03-19 DIAGNOSIS — Z3202 Encounter for pregnancy test, result negative: Secondary | ICD-10-CM | POA: Diagnosis not present

## 2018-03-19 DIAGNOSIS — Z98891 History of uterine scar from previous surgery: Secondary | ICD-10-CM

## 2018-03-19 LAB — POCT URINE PREGNANCY: Preg Test, Ur: NEGATIVE

## 2018-03-19 NOTE — Progress Notes (Signed)
Chief complaint: 1.  IUD insertion  Patient is 2 months postpartum. Status post primary low cervical transverse cesarean section for oligohydramnios and PIH. History of previous Mirena IUD use. Currently bottlefeeding. Pregnancy test is negative.  OBJECTIVE: BP 110/67   Pulse 84   Ht 5\' 1"  (1.549 m)   Wt 165 lb 8 oz (75.1 kg)   LMP 03/15/2018 (Exact Date)   BMI 31.27 kg/m  Well-appearing female no acute distress.  Alert and oriented.  Affect is appropriate. Abdomen: Soft, nontender without organomegaly Pelvic exam: External genitalia-normal BUS-normal Vagina-normal; minimal menstrual blood in the vault Cervix-no lesions; no discharge; no cervical motion tenderness Uterus-midplane, normal size shape, mobile, nontender Adnexa-nonpalpable nontender Rectovaginal-normal external exam  PROCEDURE:IUD Insertion Procedure Note  Pre-operative Diagnosis: Contraception; postpartum  Post-operative Diagnosis: same  Indications: contraception  Procedure Details  Urine pregnancy test was done.  And result was negative.  The risks (including infection, bleeding, pain, and uterine perforation) and benefits of the procedure were explained to the patient and Verbal informed consent was obtained.    Cervix cleansed with Betadine. Uterus sounded to 8 cm. IUD inserted without difficulty. String visible and trimmed to 3 cm. Patient tolerated procedure well.  IUD Information: Toney ReilMirena, Lot #[id number], Expiration date January 2022.  Condition: Stable  Complications: None  Plan:  The patient was advised to call for any fever or for prolonged or severe pain or bleeding. She was advised to use OTC acetaminophen and OTC ibuprofen as needed for mild to moderate pain.   Attending Physician Documentation: Herold HarmsMartin A Elleah Hemsley, MD   ASSESSMENT: 1.  Postpartum 2.  Contraception desired-Mirena IUD 3.  Status post successful insertion  PLAN: 1.  Mirena IUD insertion 2.  Return in 4 weeks for  IUD string check 3.  Post procedure instructions given  Herold HarmsMartin A [place] Brogden, MD  Note: This dictation was prepared with Dragon dictation along with smaller phrase technology. Any transcriptional errors that result from this process are unintentional.

## 2018-03-19 NOTE — Patient Instructions (Signed)
1. Mirena IUD is inserted today 2. Return in 4 weeks for IUD string check   Intrauterine Device Insertion, Care After This sheet gives you information about how to care for yourself after your procedure. Your health care provider may also give you more specific instructions. If you have problems or questions, contact your health care provider. What can I expect after the procedure? After the procedure, it is common to have:  Cramps and pain in the abdomen.  Light bleeding (spotting) or heavier bleeding that is like your menstrual period. This may last for up to a few days.  Lower back pain.  Dizziness.  Headaches.  Nausea.  Follow these instructions at home:  Before resuming sexual activity, check to make sure that you can feel the IUD string(s). You should be able to feel the end of the string(s) below the opening of your cervix. If your IUD string is in place, you may resume sexual activity. ? If you had a hormonal IUD inserted more than 7 days after your most recent period started, you will need to use a backup method of birth control for 7 days after IUD insertion. Ask your health care provider whether this applies to you.  Continue to check that the IUD is still in place by feeling for the string(s) after every menstrual period, or once a month.  Take over-the-counter and prescription medicines only as told by your health care provider.  Do not drive or use heavy machinery while taking prescription pain medicine.  Keep all follow-up visits as told by your health care provider. This is important. Contact a health care provider if:  You have bleeding that is heavier or lasts longer than a normal menstrual cycle.  You have a fever.  You have cramps or abdominal pain that get worse or do not get better with medicine.  You develop abdominal pain that is new or is not in the same area of earlier cramping and pain.  You feel lightheaded or weak.  You have abnormal or  bad-smelling discharge from your vagina.  You have pain during sexual activity.  You have any of the following problems with your IUD string(s): ? The string bothers or hurts you or your sexual partner. ? You cannot feel the string. ? The string has gotten longer.  You can feel the IUD in your vagina.  You think you may be pregnant, or you miss your menstrual period.  You think you may have an STI (sexually transmitted infection). Get help right away if:  You have flu-like symptoms.  You have a fever and chills.  You can feel that your IUD has slipped out of place. Summary  After the procedure, it is common to have cramps and pain in the abdomen. It is also common to have light bleeding (spotting) or heavier bleeding that is like your menstrual period.  Continue to check that the IUD is still in place by feeling for the string(s) after every menstrual period, or once a month.  Keep all follow-up visits as told by your health care provider. This is important.  Contact your health care provider if you have problems with your IUD string(s), such as the string getting longer or bothering you or your sexual partner. This information is not intended to replace advice given to you by your health care provider. Make sure you discuss any questions you have with your health care provider. Document Released: 02/15/2011 Document Revised: 05/10/2016 Document Reviewed: 05/10/2016 Elsevier Interactive Patient Education    2017 [employer]  

## 2018-04-18 ENCOUNTER — Encounter: Payer: PRIVATE HEALTH INSURANCE | Admitting: Obstetrics and Gynecology

## 2018-04-25 ENCOUNTER — Encounter: Payer: PRIVATE HEALTH INSURANCE | Admitting: Obstetrics and Gynecology

## 2018-08-19 IMAGING — US US FETAL BPP W/ NON-STRESS
1 series · 14 of 17 positions shown · non-contrast
Comparison: none

CLINICAL DATA: Pregnant, decreased fetal movement, nausea/vomiting

EXAM:
LIMITED OBSTETRIC ULTRASOUND AND BIOPHYSICAL PROFILE

[Series 1: us fetal bpp w/ non-stress · 17 acquisitions, 14 frames shown]
[im 1/17]
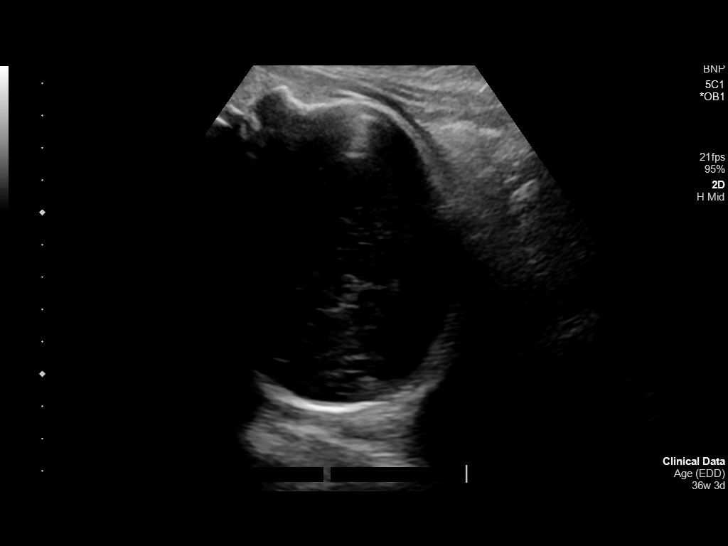
[im 2/17]
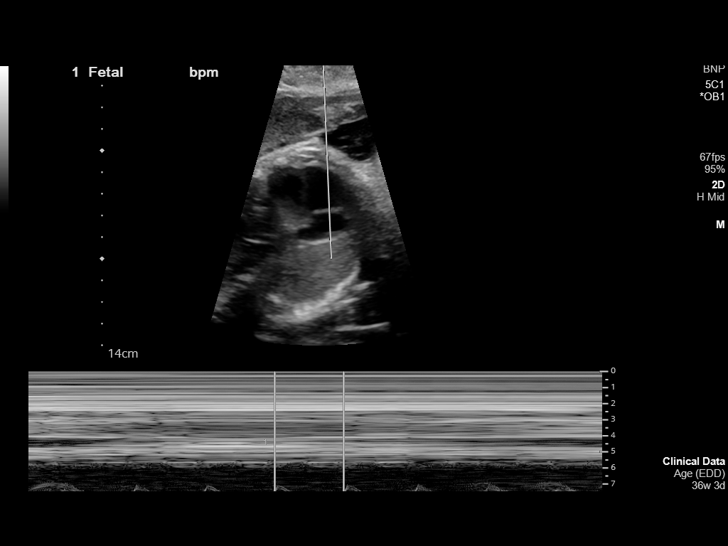
[im 4/17]
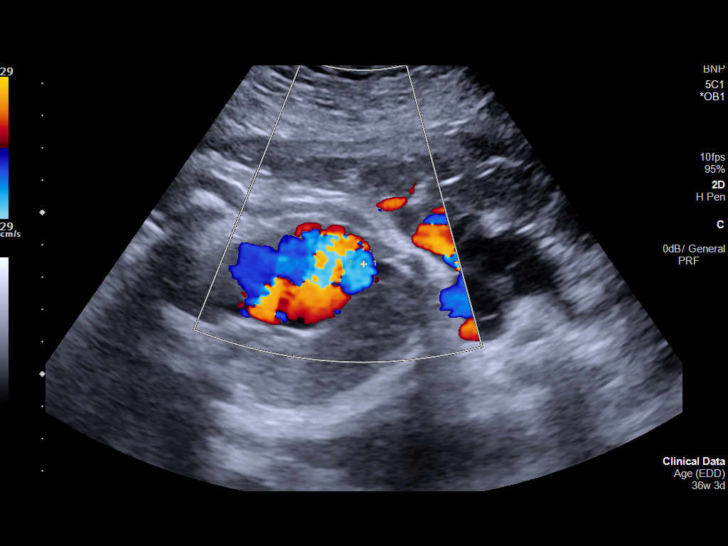
[im 5/17]
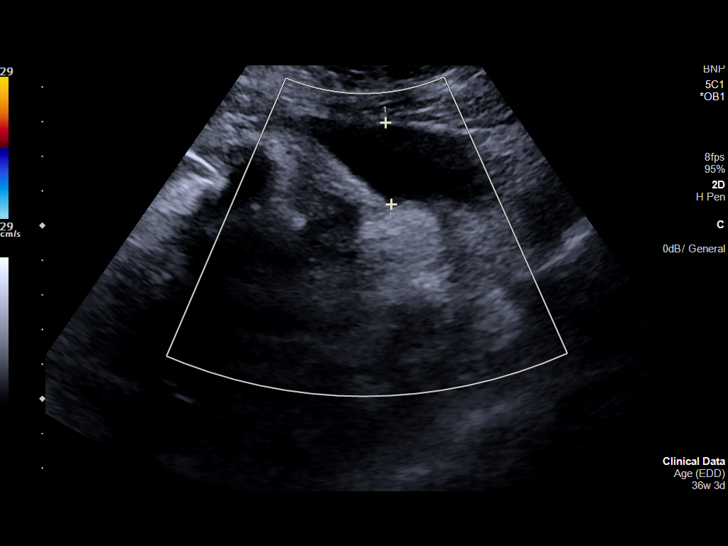
[im 6/17]
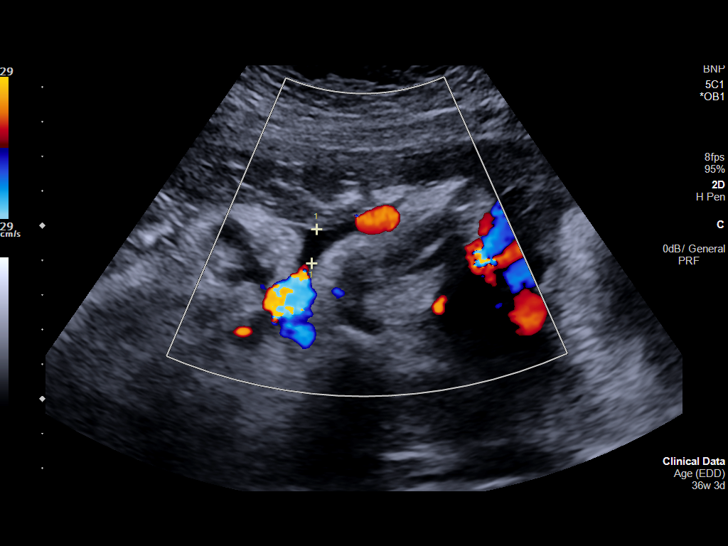
[im 7/17]
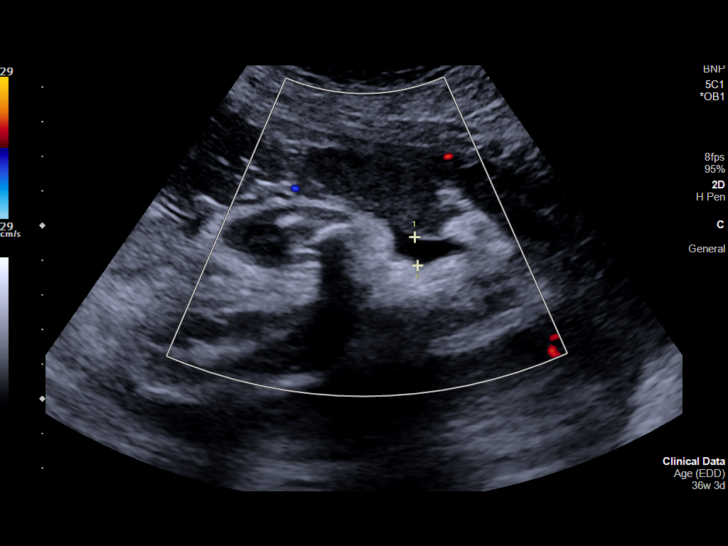
[im 8/17]
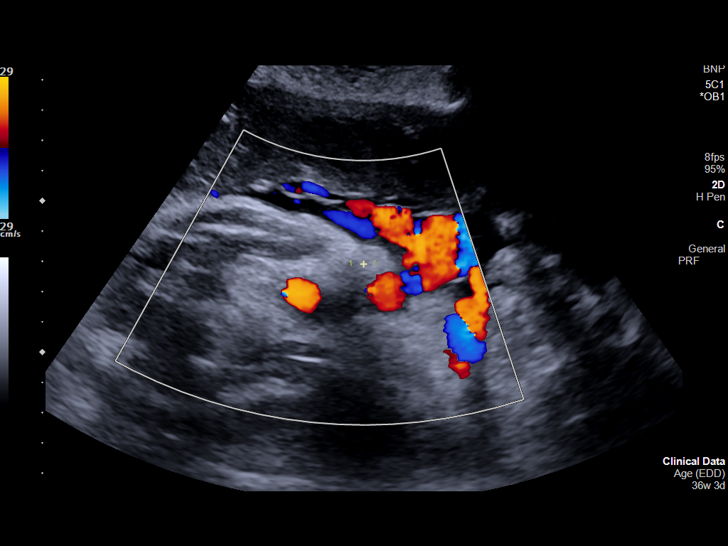
[im 10/17]
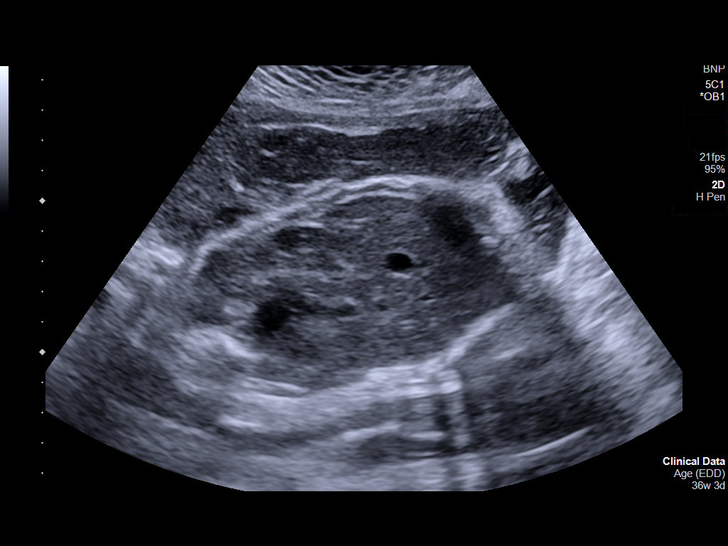
[im 11/17]
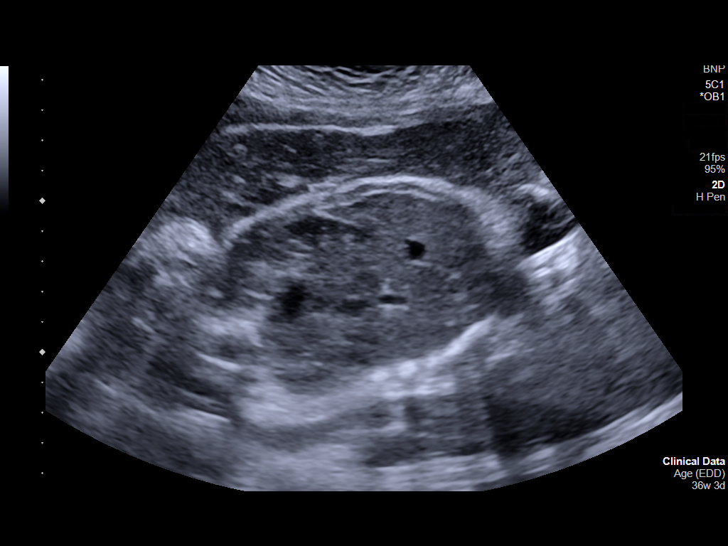
[im 12/17]
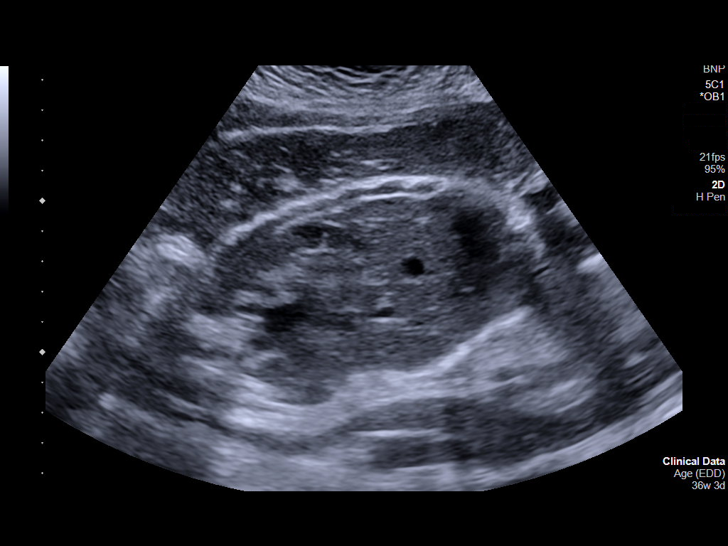
[im 13/17]
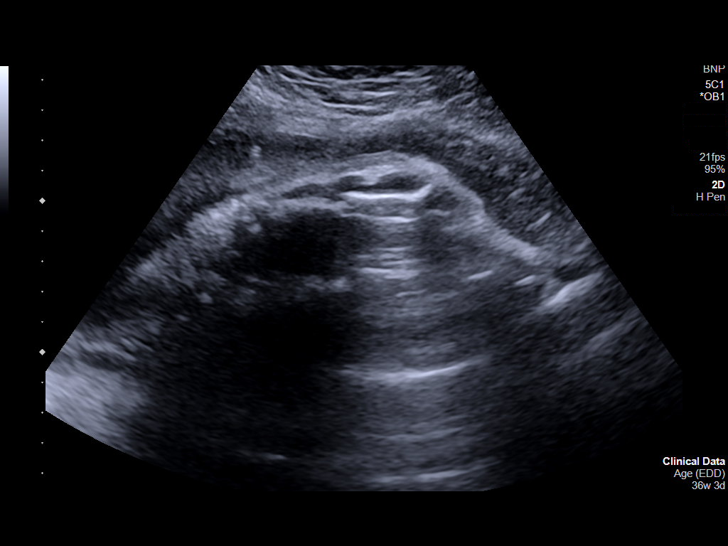
[im 14/17]
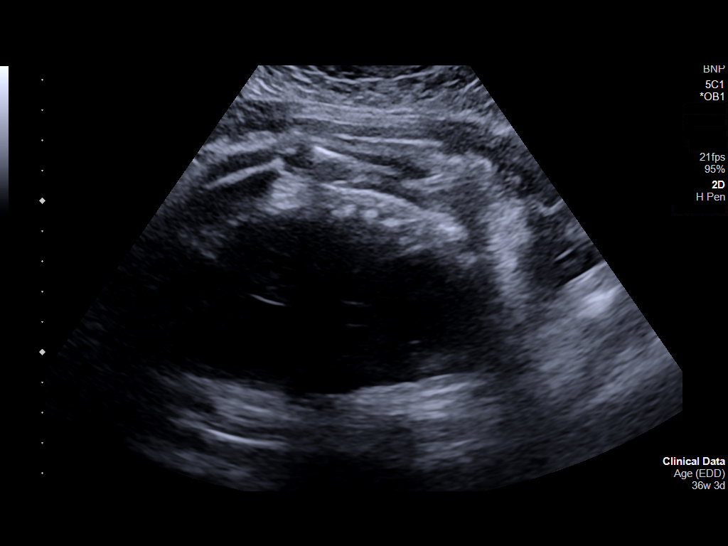
[im 16/17]
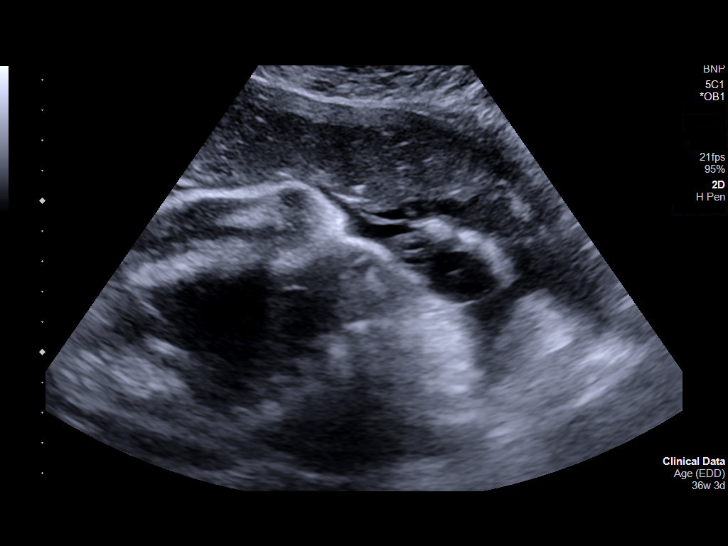
[im 17/17]
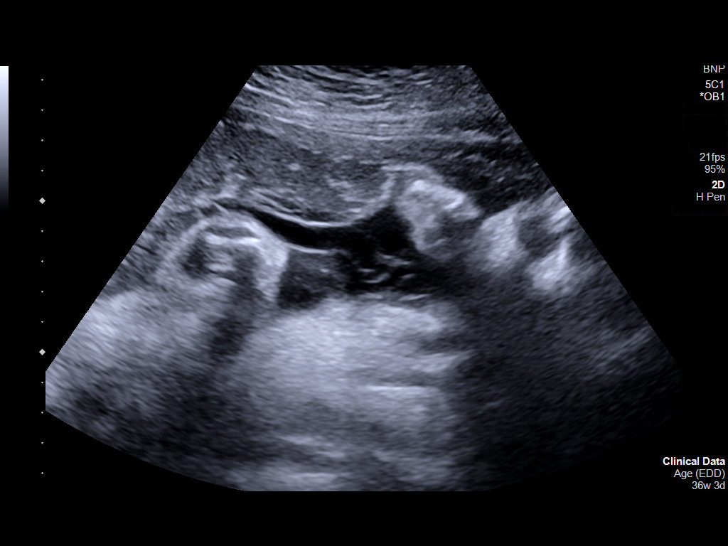

[14 of 17 positions shown; findings below may reference images not displayed]

FINDINGS: Number of Fetuses: 1

Heart Rate:  149 bpm

Movement: Yes

Presentation: Cephalic

Placental Location: Not assessed

Previa: Not evaluated

Amniotic Fluid (Subjective):  Decreased.  AFI 4.2 cm.

MATERNAL FINDINGS:

Cervix:  Not evaluated.

Uterus/Adnexae: Not evaluated.

Movement:  2  Time: 19 minutes

Breathing: 2

Tone:  2

Amniotic Fluid: 0

Total Score:  6
IMPRESSION: Single live intrauterine gestation, as above. Fetal heart rate 149
beats per minute.

Biophysical profile score is [DATE], secondary to
oligohydramnios.

This exam is performed on an emergent basis and does not
comprehensively evaluate fetal size, dating, or anatomy; follow-up
complete OB US should be considered if further fetal assessment is
warranted.

## 2019-01-14 ENCOUNTER — Encounter: Payer: PRIVATE HEALTH INSURANCE | Admitting: Obstetrics and Gynecology

## 2019-01-23 ENCOUNTER — Encounter: Payer: PRIVATE HEALTH INSURANCE | Admitting: Obstetrics and Gynecology

## 2019-06-12 ENCOUNTER — Other Ambulatory Visit: Payer: Self-pay

## 2019-06-12 DIAGNOSIS — Z20822 Contact with and (suspected) exposure to covid-19: Secondary | ICD-10-CM

## 2019-06-13 ENCOUNTER — Encounter: Payer: PRIVATE HEALTH INSURANCE | Admitting: Obstetrics and Gynecology

## 2019-06-15 ENCOUNTER — Inpatient Hospital Stay
Admission: RE | Admit: 2019-06-15 | Discharge: 2019-06-15 | Disposition: A | Discharge status: Home / Self Care | Payer: Medicaid - Out of State | Source: Ambulatory Visit

## 2019-06-15 LAB — NOVEL CORONAVIRUS, NAA: SARS-CoV-2, NAA: NOT DETECTED

## 2019-06-25 ENCOUNTER — Encounter: Payer: Self-pay | Admitting: Obstetrics and Gynecology

## 2019-06-25 ENCOUNTER — Other Ambulatory Visit: Payer: Self-pay

## 2019-06-25 ENCOUNTER — Ambulatory Visit (INDEPENDENT_AMBULATORY_CARE_PROVIDER_SITE_OTHER): Payer: PRIVATE HEALTH INSURANCE | Admitting: Obstetrics and Gynecology

## 2019-06-25 VITALS — BP 111/72 | HR 93 | Ht 61.0 in | Wt 211.3 lb

## 2019-06-25 DIAGNOSIS — R635 Abnormal weight gain: Secondary | ICD-10-CM | POA: Diagnosis not present

## 2019-06-25 NOTE — Progress Notes (Signed)
HPI:      [name] is a 28 y.o. G2P0010 who LMP was No LMP recorded. (Menstrual status: IUD).  Subjective:   She presents today to discuss weight management.  She has had a significant weight gain in the last 6 months.  She states that she has tried different methods of diet and exercise without success.  She thought maybe it had to do with her IUD.     Hx: The following portions of the patient's history were reviewed and updated as appropriate:             She  has a past medical history of GERD (gastroesophageal reflux disease) and Medical history non-contributory. She does not have any pertinent problems on file. She  has a past surgical history that includes Dilation and curettage of uterus and Cesarean section (N/A, 01/17/2018). Her family history includes CAD in her maternal grandfather; Diabetes in her maternal grandmother and mother; Rheum arthritis in her maternal grandmother and mother. She  reports that she has never smoked. She has never used smokeless tobacco. She reports that she does not drink alcohol or use drugs. She currently has no medications in their medication list. She has No Known Allergies.       Review of Systems:  Review of Systems  Constitutional: Denied constitutional symptoms, night sweats, recent illness, fatigue, fever, insomnia and weight loss.  Recent significant weight gain.  Eyes: Denied eye symptoms, eye pain, photophobia, vision change and visual disturbance.  Ears/Nose/Throat/Neck: Denied ear, nose, throat or neck symptoms, hearing loss, nasal discharge, sinus congestion and sore throat.  Cardiovascular: Denied cardiovascular symptoms, arrhythmia, chest pain/pressure, edema, exercise intolerance, orthopnea and palpitations.  Respiratory: Denied pulmonary symptoms, asthma, pleuritic pain, productive sputum, cough, dyspnea and wheezing.  Gastrointestinal: Denied, gastro-esophageal reflux, melena, nausea and vomiting.  Genitourinary:  Denied genitourinary symptoms including symptomatic vaginal discharge, pelvic relaxation issues, and urinary complaints.  Musculoskeletal: Denied musculoskeletal symptoms, stiffness, swelling, muscle weakness and myalgia.  Dermatologic: Denied dermatology symptoms, rash and scar.  Neurologic: Denied neurology symptoms, dizziness, headache, neck pain and syncope.  Psychiatric: Denied psychiatric symptoms, anxiety and depression.  Endocrine: Denied endocrine symptoms including hot flashes and night sweats.   Meds:   No current outpatient medications on file prior to visit.   No current facility-administered medications on file prior to visit.    Objective:     Vitals:   06/25/19 1506  BP: 111/72  Pulse: 93                Assessment:    G2P0010 Patient Active Problem List   Diagnosis Date Noted  . History of cesarean section 03/19/2018  . Hypokalemia 01/20/2018  . Labile blood pressure 01/20/2018  . 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 Hyperemesis affecting pregnancy, antepartum 01/13/2018  . Pregnancy 11/30/2017  . Labor and delivery indication for care or intervention 11/27/2017     1. Weight gain        Plan:            1.  Refer to [facility] medical [facility].  We have discussed her IUD and weight gain and I do not believe this is the issue.  She is excited about seeing someone specific for losing weight.  Calorie counting exercise dietary modification discussed in detail. Orders No orders of the defined types were placed in this encounter.   No orders of the defined types were placed in this encounter.     F/U  No follow-ups on  file. I spent 17 minutes involved in the care of this patient of which greater than 50% was spent discussing weight gain and weight loss management.  Relationship of weight to birth control methods.  Finis Bud, M.D. 06/25/2019 3:22 PM

## 2019-07-17 ENCOUNTER — Telehealth: Payer: Self-pay | Admitting: Obstetrics and Gynecology

## 2019-07-17 NOTE — Telephone Encounter (Signed)
Pt was informed by dr Logan Bores to call a clinic  In [place] for weight management that may take her medicaid .  Pt called they dont take the medicaid. Pt was told by evans that if they dont he can sent in her a prescription. Please advise

## 2019-07-21 NOTE — Telephone Encounter (Signed)
Please advise on prescription for weight loss.

## 2019-07-24 NOTE — Telephone Encounter (Signed)
Scheduled patient to come in to discuss weight loss medication. She has family planning medicaid so weight will not take this type of medicaid.

## 2019-08-06 ENCOUNTER — Ambulatory Visit: Payer: PRIVATE HEALTH INSURANCE | Admitting: Obstetrics and Gynecology

## 2020-04-13 ENCOUNTER — Other Ambulatory Visit: Payer: Self-pay

## 2020-04-13 ENCOUNTER — Encounter: Payer: Self-pay | Admitting: Obstetrics and Gynecology

## 2020-04-13 ENCOUNTER — Ambulatory Visit (INDEPENDENT_AMBULATORY_CARE_PROVIDER_SITE_OTHER): Payer: PRIVATE HEALTH INSURANCE | Admitting: Obstetrics and Gynecology

## 2020-04-13 VITALS — BP 126/82 | HR 83 | Ht 61.0 in | Wt 219.9 lb

## 2020-04-13 DIAGNOSIS — Z30432 Encounter for removal of intrauterine contraceptive device: Secondary | ICD-10-CM

## 2020-04-13 DIAGNOSIS — Z30011 Encounter for initial prescription of contraceptive pills: Secondary | ICD-10-CM

## 2020-04-13 MED ORDER — LEVONORGEST-ETH ESTRAD 91-DAY 0.15-0.03 &0.01 MG PO TABS: 1.0000 | ORAL_TABLET | Freq: Every day | ORAL | 1 refills | Status: DC

## 2020-04-13 NOTE — Progress Notes (Signed)
HPI:      [name] is a 29 y.o. G2P0010 who LMP was No LMP recorded (lmp unknown). (Menstrual status: IUD).  Subjective:   She presents today requesting IUD removal.  She has a Mirena and she says that she has some type of spotting every month.  Her complaint is not that the bleeding is heavy but that it is irregular and she cannot make plans.  She has had several situations where she has had unexpected bleeding. She has taken OCPs before it would like to use OCPs for birth control. Patient states that she recently used some receive and since that time has had some vulvar irritation that she says feels like an allergic reaction.  She denies vaginal discharge or odor    Hx: The following portions of the patient's history were reviewed and updated as appropriate:             She  has a past medical history of GERD (gastroesophageal reflux disease) and Medical history non-contributory. She does not have any pertinent problems on file. She  has a past surgical history that includes Dilation and curettage of uterus and Cesarean section (N/A, 01/17/2018). Her family history includes CAD in her maternal grandfather; Diabetes in her maternal grandmother and mother; Rheum arthritis in her maternal grandmother and mother. She  reports that she has never smoked. She has never used smokeless tobacco. She reports that she does not drink alcohol and does not use drugs. She currently has no medications in their medication list. She has No Known Allergies.       Review of Systems:  Review of Systems  Constitutional: Denied constitutional symptoms, night sweats, recent illness, fatigue, fever, insomnia and weight loss.  Eyes: Denied eye symptoms, eye pain, photophobia, vision change and visual disturbance.  Ears/Nose/Throat/Neck: Denied ear, nose, throat or neck symptoms, hearing loss, nasal discharge, sinus congestion and sore throat.  Cardiovascular: Denied cardiovascular symptoms,  arrhythmia, chest pain/pressure, edema, exercise intolerance, orthopnea and palpitations.  Respiratory: Denied pulmonary symptoms, asthma, pleuritic pain, productive sputum, cough, dyspnea and wheezing.  Gastrointestinal: Denied, gastro-esophageal reflux, melena, nausea and vomiting.  Genitourinary: See HPI for additional information.  Musculoskeletal: Denied musculoskeletal symptoms, stiffness, swelling, muscle weakness and myalgia.  Dermatologic: Denied dermatology symptoms, rash and scar.  Neurologic: Denied neurology symptoms, dizziness, headache, neck pain and syncope.  Psychiatric: Denied psychiatric symptoms, anxiety and depression.  Endocrine: Denied endocrine symptoms including hot flashes and night sweats.   Meds:   No current outpatient medications on file prior to visit.   No current facility-administered medications on file prior to visit.       Upstream - 04/13/20 1457      Pregnancy Intention Screening   Does the patient want to become pregnant in the next year? No    Does the patient's partner want to become pregnant in the next year? No    Would the patient like to discuss contraceptive options today? Yes      Contraception Wrap Up   Current Method IUD or IUS    End Method Oral Contraceptive    Contraception Counseling Provided Yes          The pregnancy intention screening data noted above was reviewed. Potential methods of contraception were discussed. The patient elected to proceed with Oral Contraceptive.     Objective:     Vitals:   04/13/20 1456  BP: 126/82  Pulse: 83   Filed Weights   04/13/20 1456  Weight: 219  lb 14.4 oz (99.7 kg)              Physical examination   Pelvic:   Vulva: Normal appearance.  No lesions.  Vagina: No lesions or abnormalities noted.  Support: Normal pelvic support.  Urethra No masses tenderness or scarring.  Meatus Normal size without lesions or prolapse.  Cervix: Normal appearance.  No lesions.  Anus: Normal  exam.  No lesions.  Perineum: Normal exam.  No lesions.        Bimanual   Uterus: Normal size.  Non-tender.  Mobile.  AV.  Adnexae: No masses.  Non-tender to palpation.  Cul-de-sac: Negative for abnormality.   IUD Removal Strings of IUD identified and grasped.  IUD removed without problem.  Pt tolerated this well.  IUD noted to be intact.     Assessment:    G2P0010 Patient Active Problem List   Diagnosis Date Noted  . History of cesarean section 03/19/2018  . Hypokalemia 01/20/2018  . Labile blood pressure 01/20/2018  . 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 Hyperemesis affecting pregnancy, antepartum 01/13/2018  . Pregnancy 11/30/2017  . Labor and delivery indication for care or intervention 11/27/2017     1. Encounter for IUD removal   2. Initiation of OCP (BCP)        Plan:            1. OCPs The risks /benefits of OCPs have been explained to the patient in detail.  Product literature has been given to her where appropriate.  I have instructed her in the use of OCPs.  I have explained to the patient that OCPs are not as effective for birth control during the first month of use, and that another form of contraception should be used during this time.  Both first-day start and Sunday start have been explained.  The risks and benefits of each was discussed.  She has been made aware of  the fact that in rare circumstances, other medications may affect the efficacy of OCPs.  I have answered all of her questions, and I believe that she has an understanding of the effectiveness and use of OCPs. Patient would like to start [facility]. 2.  Intermittent vulvar irritation patient to present if symptoms worsen.  Consider check at annual examination 3.  Patient to schedule annual examination Orders No orders of the defined types were placed in this encounter.   No orders of the defined types were placed in this encounter.     F/U  Return for Annual Physical. I spent 22 minutes  involved in the care of this patient preparing to see the patient by obtaining and reviewing her medical history (including labs, imaging tests and prior procedures), documenting clinical information in the electronic health record (EHR), counseling and coordinating care plans, writing and sending prescriptions, ordering tests or procedures and directly communicating with the patient by discussing pertinent items from her history and physical exam as well as detailing my assessment and plan as noted above so that she has an informed understanding.  All of her questions were answered.  Elonda Husky, M.D. 04/13/2020 3:17 PM

## 2020-05-12 ENCOUNTER — Telehealth: Payer: Self-pay

## 2020-05-12 NOTE — Telephone Encounter (Signed)
Patient called in stating that she started taking birth control pills and that ever since she started taking these pills that she's had a lot of irregular bleeding. Patient would like to know if this is normal or if she would need to come in to see her provider.  Could you please advise?

## 2020-05-13 NOTE — Telephone Encounter (Signed)
LM for patient to return call.

## 2020-05-21 NOTE — Telephone Encounter (Signed)
LM for patient to return call.

## 2020-06-23 ENCOUNTER — Telehealth: Payer: Self-pay

## 2020-06-23 NOTE — Telephone Encounter (Signed)
Spoke with patient and she is going to finish out the 3 months with the [facility] pills to see if her bleeding gets better. I have scheduled her to come in January to discuss with Dr. Logan Bores.

## 2020-06-23 NOTE — Telephone Encounter (Signed)
Pt called in and is requesting that her BC  changed. The pt is having irregular bleeding. The pt stated that no one called her back. The pt was asked to verify her phone number. I changed it we had the wrong number on file. I informed her that the nurse did try to call and that your number wasn't correct. The pt uses [facility] on [address] please advise

## 2020-07-02 ENCOUNTER — Ambulatory Visit: Admit: 2020-07-02 | Discharge status: Absent without leave | Payer: Medicaid - Out of State

## 2020-07-20 ENCOUNTER — Ambulatory Visit: Payer: PRIVATE HEALTH INSURANCE | Admitting: Obstetrics and Gynecology

## 2020-08-03 ENCOUNTER — Encounter: Payer: Self-pay | Admitting: Obstetrics and Gynecology

## 2020-10-03 ENCOUNTER — Other Ambulatory Visit: Payer: Self-pay | Admitting: Obstetrics and Gynecology

## 2020-10-03 DIAGNOSIS — Z30011 Encounter for initial prescription of contraceptive pills: Secondary | ICD-10-CM

## 2021-01-19 ENCOUNTER — Telehealth: Payer: PRIVATE HEALTH INSURANCE | Admitting: Obstetrics and Gynecology

## 2021-01-19 NOTE — Telephone Encounter (Signed)
I have scheduled patient to come in tomorrow.

## 2021-01-19 NOTE — Telephone Encounter (Signed)
Cindy Gibson called in and states she would like a nurse to call her.  When I asked her what was going on, she stated "heavy blood clots and bleeding out of the blue"  That was all she was willing to tell me and stated she just wanted a call back.  Please advise.

## 2021-01-19 NOTE — Telephone Encounter (Signed)
Tried to call patient 01/19/21 @ 2:51 PM. Voicemail was full so unable to leave message.

## 2021-01-20 ENCOUNTER — Ambulatory Visit (INDEPENDENT_AMBULATORY_CARE_PROVIDER_SITE_OTHER): Payer: Medicaid Other | Admitting: Obstetrics and Gynecology

## 2021-01-20 ENCOUNTER — Other Ambulatory Visit: Payer: Self-pay

## 2021-01-20 ENCOUNTER — Encounter: Payer: Self-pay | Admitting: Obstetrics and Gynecology

## 2021-01-20 VITALS — BP 132/85 | HR 103 | Ht 61.0 in | Wt 218.8 lb

## 2021-01-20 DIAGNOSIS — N921 Excessive and frequent menstruation with irregular cycle: Secondary | ICD-10-CM

## 2021-01-20 MED ORDER — NORETHIN ACE-ETH ESTRAD-FE 1.5-30 MG-MCG PO TABS
1.0000 | ORAL_TABLET | Freq: Every day | ORAL | 2 refills | Status: DC
Start: 2021-01-20 — End: 2022-04-13

## 2021-01-20 NOTE — Progress Notes (Signed)
HPI:      Cindy Gibson is a 30 y.o. G2P0010 who LMP was No LMP recorded.  Subjective:   She presents today because she has been passing clots during active birth control pills as well as having her normal period at the end of each 65-month pack.  She showed me a picture on her phone of some small blood clots that occur after sleeping or lying down. (Seems likely that she is having some bleeding into the vagina, the clots are forming and then when she gets up to use the restroom she passes the clots) She has previously had trouble with breakthrough bleeding with Mirena as well.    Hx: The following portions of the patient's history were reviewed and updated as appropriate:             She  has a past medical history of GERD (gastroesophageal reflux disease) and Medical history non-contributory. She does not have any pertinent problems on file. She  has a past surgical history that includes Dilation and curettage of uterus and Cesarean section (N/A, 01/17/2018). Her family history includes CAD in her maternal grandfather; Diabetes in her maternal grandmother and mother; Rheum arthritis in her maternal grandmother and mother. She  reports that she has never smoked. She has never used smokeless tobacco. She reports that she does not drink alcohol and does not use drugs. She has a current medication list which includes the following prescription(s): norethindrone-ethinyl estradiol-iron. She has No Known Allergies.       Review of Systems:  Review of Systems  Constitutional: Denied constitutional symptoms, night sweats, recent illness, fatigue, fever, insomnia and weight loss.  Eyes: Denied eye symptoms, eye pain, photophobia, vision change and visual disturbance.  Ears/Nose/Throat/Neck: Denied ear, nose, throat or neck symptoms, hearing loss, nasal discharge, sinus congestion and sore throat.  Cardiovascular: Denied cardiovascular symptoms, arrhythmia, chest pain/pressure, edema,  exercise intolerance, orthopnea and palpitations.  Respiratory: Denied pulmonary symptoms, asthma, pleuritic pain, productive sputum, cough, dyspnea and wheezing.  Gastrointestinal: Denied, gastro-esophageal reflux, melena, nausea and vomiting.  Genitourinary: See HPI for additional information.  Musculoskeletal: Denied musculoskeletal symptoms, stiffness, swelling, muscle weakness and myalgia.  Dermatologic: Denied dermatology symptoms, rash and scar.  Neurologic: Denied neurology symptoms, dizziness, headache, neck pain and syncope.  Psychiatric: Denied psychiatric symptoms, anxiety and depression.  Endocrine: Denied endocrine symptoms including hot flashes and night sweats.   Meds:   No current outpatient medications on file prior to visit.   No current facility-administered medications on file prior to visit.      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 Breakthrough bleeding on birth control pills     Patient often has breakthrough bleeding with birth control including OCPs and Mirena.  Seasonique obviously not working for the desired effect of menses every 3 months.  (Possibly small submucosal fibroid?/)   Plan:  1.  Discussed alternatives including NuvaRing.  She has decided to switch to a cyclic pill.  I have prescribed 1.5 30s for cycle control.  Orders No orders of the defined types were placed in this encounter.    Meds ordered this encounter  Medications    norethindrone-ethinyl estradiol-iron (LOESTRIN FE 1.5/30) 1.5-30 MG-MCG tablet    Sig: Take 1 tablet by mouth at bedtime.    Dispense:  84 tablet    Refill:  2      F/U  No follow-ups on file. I spent 23 minutes involved in the care of this patient preparing to see the patient by obtaining and reviewing her medical history (including labs, imaging tests and prior procedures), documenting clinical information in the electronic health record (EHR), counseling and coordinating care plans, writing and sending prescriptions, ordering tests or procedures and directly communicating with the patient by discussing pertinent items from her history and physical exam as well as detailing my assessment and plan as noted above so that she has an informed understanding.  All of her questions were answered.  Cindy Gibson, M.D. 01/20/2021 2:40 PM

## 2022-04-13 ENCOUNTER — Encounter: Payer: Self-pay | Admitting: Obstetrics and Gynecology

## 2022-04-13 ENCOUNTER — Other Ambulatory Visit (HOSPITAL_COMMUNITY)
Admission: RE | Admit: 2022-04-13 | Discharge: 2022-04-13 | Disposition: A | Discharge status: Home / Self Care | Payer: Medicaid Other | Source: Ambulatory Visit | Attending: Obstetrics and Gynecology | Admitting: Obstetrics and Gynecology

## 2022-04-13 ENCOUNTER — Ambulatory Visit (INDEPENDENT_AMBULATORY_CARE_PROVIDER_SITE_OTHER): Payer: Medicaid Other | Admitting: Obstetrics and Gynecology

## 2022-04-13 VITALS — BP 120/78 | HR 111 | Ht 61.0 in | Wt 245.6 lb

## 2022-04-13 DIAGNOSIS — Z01419 Encounter for gynecological examination (general) (routine) without abnormal findings: Secondary | ICD-10-CM | POA: Insufficient documentation

## 2022-04-13 DIAGNOSIS — Z124 Encounter for screening for malignant neoplasm of cervix: Secondary | ICD-10-CM

## 2022-04-13 NOTE — Progress Notes (Signed)
HPI:      [name] is a 31 y.o. G2P0010 who LMP was Patient's last menstrual period was 04/02/2022 (exact date).  Subjective:   She presents today for her annual examination.  She has been attempting pregnancy for 9 months with unprotected intercourse.  She has recently been using an app on her phone for timing and has now purchased ovulation predictor kits but has not begun to systematically use them yet. She reports monthly menses but says sometimes they only last 1 day.  She is getting only 1 menses per month. She and her current partner have a previous child together.    Hx: The following portions of the patient's history were reviewed and updated as appropriate:             She  has a past medical history of GERD (gastroesophageal reflux disease) and Medical history non-contributory. She does not have any pertinent problems on file. She  has a past surgical history that includes Dilation and curettage of uterus and Cesarean section (N/A, 01/17/2018). Her family history includes CAD in her maternal grandfather; Diabetes in her maternal grandmother and mother; Rheum arthritis in her maternal grandmother and mother. She  reports that she has never smoked. She has never used smokeless tobacco. She reports that she does not drink alcohol and does not use drugs. She currently has no medications in their medication list. She has No Known Allergies.       Review of Systems:  Review of Systems  Constitutional: Denied constitutional symptoms, night sweats, recent illness, fatigue, fever, insomnia and weight loss.  Eyes: Denied eye symptoms, eye pain, photophobia, vision change and visual disturbance.  Ears/Nose/Throat/Neck: Denied ear, nose, throat or neck symptoms, hearing loss, nasal discharge, sinus congestion and sore throat.  Cardiovascular: Denied cardiovascular symptoms, arrhythmia, chest pain/pressure, edema, exercise intolerance, orthopnea and palpitations.  Respiratory:  Denied pulmonary symptoms, asthma, pleuritic pain, productive sputum, cough, dyspnea and wheezing.  Gastrointestinal: Denied, gastro-esophageal reflux, melena, nausea and vomiting.  Genitourinary: Denied genitourinary symptoms including symptomatic vaginal discharge, pelvic relaxation issues, and urinary complaints.  Musculoskeletal: Denied musculoskeletal symptoms, stiffness, swelling, muscle weakness and myalgia.  Dermatologic: Denied dermatology symptoms, rash and scar.  Neurologic: Denied neurology symptoms, dizziness, headache, neck pain and syncope.  Psychiatric: Denied psychiatric symptoms, anxiety and depression.  Endocrine: Denied endocrine symptoms including hot flashes and night sweats.   Meds:   No current outpatient medications on file prior to visit.   No current facility-administered medications on file prior to visit.     Objective:     Vitals:   04/13/22 0911  BP: 120/78  Pulse: (!) 111    Filed Weights   04/13/22 0911  Weight: 245 lb 9.6 oz (111.4 kg)              Physical examination General NAD, Conversant  HEENT Atraumatic; Op clear with mmm.  Normo-cephalic. Pupils reactive. Anicteric sclerae  Thyroid/Neck Smooth without nodularity or enlargement. Normal ROM.  Neck Supple.  Skin No rashes, lesions or ulceration. Normal palpated skin turgor. No nodularity.  Breasts: No masses or discharge.  Symmetric.  No axillary adenopathy.  Lungs: Clear to auscultation.No rales or wheezes. Normal Respiratory effort, no retractions.  Heart: NSR.  No murmurs or rubs appreciated. No periferal edema  Abdomen: Soft.  Non-tender.  No masses.  No HSM. No hernia  Extremities: Moves all appropriately.  Normal ROM for age. No lymphadenopathy.  Neuro: Oriented to PPT.  Normal mood. Normal affect.  Pelvic:   Vulva: Normal appearance.  No lesions.  Vagina: No lesions or abnormalities noted.  Support: Normal pelvic support.  Urethra No masses tenderness or scarring.  Meatus  Normal size without lesions or prolapse.  Cervix: Normal appearance.  No lesions.  Anus: Normal exam.  No lesions.  Perineum: Normal exam.  No lesions.        Bimanual   Uterus: Normal size.  Non-tender.  Mobile.  AV.  Adnexae: No masses.  Non-tender to palpation.  Cul-de-sac: Negative for abnormality.     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 Well woman exam with routine gynecological exam   2. Cervical cancer screening     Patient attempting pregnancy   Plan:            1.  Basic Screening Recommendations The basic screening recommendations for asymptomatic women were discussed with the patient during her visit.  The age-appropriate recommendations were discussed with her and the rational for the tests reviewed.  When I am informed by the patient that another primary care physician has previously obtained the age-appropriate tests and they are up-to-date, only outstanding tests are ordered and referrals given as necessary.  Abnormal results of tests will be discussed with her when all of her results are completed.  Routine preventative health maintenance measures emphasized: Exercise/Diet/Weight control, Tobacco Warnings, Alcohol/Substance use risks and Stress Management Pap performed 2.  Discussed timing of intercourse and use of vibration predictor kits in detail.  If patient not pregnant by February consider hormonal work-up because of her very light menses but most importantly perform semen analysis. All questions answered. -Continue use of prenatal vitamins while attempting pregnancy. Orders No orders of the defined types were placed in this  encounter.   No orders of the defined types were placed in this encounter.         F/U  Return in about 4 months (around 08/14/2022).  Finis Bud, M.D. 04/13/2022 10:08 AM

## 2022-04-13 NOTE — Progress Notes (Signed)
Patients presents for annual exam today. She states she would like to discuss pregnancy in the next year. Due for pap smear, ordered. Patient states no other questions or concerns at this time.

## 2022-04-14 LAB — CYTOLOGY - PAP
Adequacy: ABSENT
Diagnosis: NEGATIVE

## 2022-08-30 ENCOUNTER — Ambulatory Visit (INDEPENDENT_AMBULATORY_CARE_PROVIDER_SITE_OTHER): Payer: Medicaid Other | Admitting: Obstetrics and Gynecology

## 2022-08-30 ENCOUNTER — Encounter: Payer: Self-pay | Admitting: Obstetrics and Gynecology

## 2022-08-30 VITALS — BP 137/82 | HR 128 | Ht 61.0 in | Wt 242.8 lb

## 2022-08-30 DIAGNOSIS — Z3169 Encounter for other general counseling and advice on procreation: Secondary | ICD-10-CM | POA: Diagnosis not present

## 2022-08-30 NOTE — Progress Notes (Signed)
HPI:      [name] is a 32 y.o. G2P0010 who LMP was No LMP recorded.  Subjective:   She presents today because she has been attempting pregnancy for more than a year and a half.  She is doing ovulation predictor kits which show she is ovulatory.  She still has not had a resultant pregnancy.  She and her current partner have had a child together before.  She reports unintended weight gain. Based on her cycle tracking she is ovulating appropriately and having menses between day 27 and 29. Her ovulation detector kits are turning positive on day 12 or 13.    Hx: The following portions of the patient's history were reviewed and updated as appropriate:             She  has a past medical history of GERD (gastroesophageal reflux disease) and Medical history non-contributory. She does not have any pertinent problems on file. She  has a past surgical history that includes Dilation and curettage of uterus and Cesarean section (N/A, 01/17/2018). Her family history includes CAD in her maternal grandfather; Diabetes in her maternal grandmother and mother; Rheum arthritis in her maternal grandmother and mother. She  reports that she has never smoked. She has never used smokeless tobacco. She reports that she does not drink alcohol and does not use drugs. She has a current medication list which includes the following prescription(s): multivitamin-prenatal. She has No Known Allergies.       Review of Systems:  Review of Systems  Constitutional: Denied constitutional symptoms, night sweats, recent illness, fatigue, fever, insomnia and weight loss.  Eyes: Denied eye symptoms, eye pain, photophobia, vision change and visual disturbance.  Ears/Nose/Throat/Neck: Denied ear, nose, throat or neck symptoms, hearing loss, nasal discharge, sinus congestion and sore throat.  Cardiovascular: Denied cardiovascular symptoms, arrhythmia, chest pain/pressure, edema, exercise intolerance, orthopnea and  palpitations.  Respiratory: Denied pulmonary symptoms, asthma, pleuritic pain, productive sputum, cough, dyspnea and wheezing.  Gastrointestinal: Denied, gastro-esophageal reflux, melena, nausea and vomiting.  Genitourinary: Denied genitourinary symptoms including symptomatic vaginal discharge, pelvic relaxation issues, and urinary complaints.  Musculoskeletal: Denied musculoskeletal symptoms, stiffness, swelling, muscle weakness and myalgia.  Dermatologic: Denied dermatology symptoms, rash and scar.  Neurologic: Denied neurology symptoms, dizziness, headache, neck pain and syncope.  Psychiatric: Denied psychiatric symptoms, anxiety and depression.  Endocrine: Denied endocrine symptoms including hot flashes and night sweats.   Meds:   Current Outpatient Medications on File Prior to Visit  Medication Sig Dispense Refill   Prenatal Vit-Fe Fumarate-FA (MULTIVITAMIN-PRENATAL) 27-0.8 MG TABS tablet Take 1 tablet by mouth daily at 12 noon.     No current facility-administered medications on file prior to visit.      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 Pre-conception counseling     Patient having trouble with conception.  She seems  to be ovulatory with adequate luteal phase.   Plan:            1.  Labs as ordered  2.  Strongly recommend semen analysis.  Equipment and information given.  3.  Adjustment to ovulation predictor kits and their use discussed in detail.  Orders Orders  Placed This Encounter  Procedures   Glucose, fasting   Insulin, random   Prolactin   Testosterone, Free, Total, SHBG   TSH    No orders of the defined types were placed in this encounter.     F/U  No follow-ups on file. I spent 23 minutes involved in the care of this patient preparing to see the patient by obtaining and reviewing her medical history (including labs, imaging tests and prior procedures), documenting clinical information in the electronic health record (EHR), counseling and coordinating care plans, writing and sending prescriptions, ordering tests or procedures and in direct communicating with the patient and medical staff discussing pertinent items from her history and physical exam.  Finis Bud, M.D. 08/30/2022 3:55 PM

## 2022-08-30 NOTE — Progress Notes (Signed)
Patient presents today to discuss conception. She states trying now for approximately 1.5 years, currently uses ovulation kits. No additional concerns.

## 2022-09-03 LAB — INSULIN, RANDOM: INSULIN: 75 u[IU]/mL — ABNORMAL HIGH (ref 2.6–24.9)

## 2022-09-03 LAB — TESTOSTERONE, FREE, TOTAL, SHBG
Sex Hormone Binding: 39 nmol/L (ref 24.6–122.0)
Testosterone, Free: 0.7 pg/mL (ref 0.0–4.2)
Testosterone: 12 ng/dL — ABNORMAL LOW (ref 13–71)

## 2022-09-03 LAB — PROLACTIN: Prolactin: 16.3 ng/mL (ref 4.8–33.4)

## 2022-09-03 LAB — GLUCOSE, FASTING: Glucose, Plasma: 93 mg/dL (ref 70–99)

## 2022-09-03 LAB — TSH: TSH: 0.483 u[IU]/mL (ref 0.450–4.500)

## 2022-09-11 ENCOUNTER — Telehealth: Payer: Self-pay

## 2022-09-11 NOTE — Telephone Encounter (Signed)
Follow-up scheduled for after semen analysis is completed

## 2022-09-20 ENCOUNTER — Telehealth: Payer: Self-pay

## 2022-09-20 NOTE — Telephone Encounter (Signed)
Pt calling about her appt tomorrow; please call.  [phone]  Pt's hsb wasn't able to get an appt for semen analysis until 4/4th and she wants to know what Dr. Amalia Hailey wants her to do about her elevated insulin.  Adv pt to keep her appt tomorrow morning c DJE; to log into mychart at 7:50.  We will discuss semen analysis when we get the results.  Pt agreeable.

## 2022-09-21 ENCOUNTER — Telehealth (INDEPENDENT_AMBULATORY_CARE_PROVIDER_SITE_OTHER): Payer: Medicaid Other | Admitting: Obstetrics and Gynecology

## 2022-09-21 ENCOUNTER — Encounter: Payer: Self-pay | Admitting: Obstetrics and Gynecology

## 2022-09-21 DIAGNOSIS — E88819 Insulin resistance, unspecified: Secondary | ICD-10-CM

## 2022-09-21 MED ORDER — METFORMIN HCL 500 MG PO TABS: ORAL_TABLET | ORAL | 5 refills | Status: DC

## 2022-09-21 NOTE — Progress Notes (Signed)
Virtual Visit via Video Note  I connected with Cindy Gibson on 09/21/22 at  7:55 AM EDT by video and verified that I was speaking with the correct person using two identifiers.    Cindy Gibson is a 32 y.o. G2P0010 who LMP was No LMP recorded. I discussed the limitations, risks, security and privacy concerns of performing an evaluation and management service by video and the availability of in person appointments. I also discussed with the patient that there may be a patient responsible charge related to this service. The patient expressed understanding and agreed to proceed.  Location of patient: Home  Patient gave explicit verbal consent for video visit:  YES  Location of provider:  [facility] office  Persons other than physician and patient involved in provider conference:  None   Subjective:   History of Present Illness:    She continues to do ovulation predictor kits which show she is ovulatory.  She has 27 to 29-day cycles.  She has been having unprotected intercourse appropriately.  She recently underwent some lab work which reveals that she has insulin resistance with a significantly elevated insulin level. In addition, she has scheduled her partner for a semen analysis which will take place on April 4.  Hx: The following portions of the patient's history were reviewed and updated as appropriate:             She  has a past medical history of GERD (gastroesophageal reflux disease) and Medical history non-contributory. She does not have any pertinent problems on file. She  has a past surgical history that includes Dilation and curettage of uterus and Cesarean section (N/A, 01/17/2018). Her family history includes CAD in her maternal grandfather; Diabetes in her maternal grandmother and mother; Rheum arthritis in her maternal grandmother and mother. She  reports that she has never smoked. She has never used smokeless tobacco. She reports that she does not drink alcohol and  does not use drugs. She has a current medication list which includes the following prescription(s): metformin and multivitamin-prenatal. She has No Known Allergies.       Review of Systems:  Review of Systems  Constitutional: Denied constitutional symptoms, night sweats, recent illness, fatigue, fever, insomnia and weight loss.  Eyes: Denied eye symptoms, eye pain, photophobia, vision change and visual disturbance.  Ears/Nose/Throat/Neck: Denied ear, nose, throat or neck symptoms, hearing loss, nasal discharge, sinus congestion and sore throat.  Cardiovascular: Denied cardiovascular symptoms, arrhythmia, chest pain/pressure, edema, exercise intolerance, orthopnea and palpitations.  Respiratory: Denied pulmonary symptoms, asthma, pleuritic pain, productive sputum, cough, dyspnea and wheezing.  Gastrointestinal: Denied, gastro-esophageal reflux, melena, nausea and vomiting.  Genitourinary: Denied genitourinary symptoms including symptomatic vaginal discharge, pelvic relaxation issues, and urinary complaints.  Musculoskeletal: Denied musculoskeletal symptoms, stiffness, swelling, muscle weakness and myalgia.  Dermatologic: Denied dermatology symptoms, rash and scar.  Neurologic: Denied neurology symptoms, dizziness, headache, neck pain and syncope.  Psychiatric: Denied psychiatric symptoms, anxiety and depression.  Endocrine: Denied endocrine symptoms including hot flashes and night sweats.   Meds:   Current Outpatient Medications on File Prior to Visit  Medication Sig Dispense Refill   Prenatal Vit-Fe Fumarate-FA (MULTIVITAMIN-PRENATAL) 27-0.8 MG TABS tablet Take 1 tablet by mouth daily at 12 noon.     No current facility-administered medications on file prior to visit.    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 Insulin resistance       Plan:            1.  We have discussed insulin resistance in detail.  Its effect on fertility was also discussed.  The use of metformin was discussed.  I have begun her on metformin.  2.  I have encouraged her to continue forth with the semen analysis so that if letrozole was indicated later we would at least have that on record. Orders No orders of the defined types were placed in this encounter.    Meds ordered this encounter  Medications   metFORMIN (GLUCOPHAGE) 500 MG tablet    Sig: Take one tablet by mouth daily for one week. Then increase to one tablet twice a day for one week.  Then two tablets twice a day.    Dispense:  60 tablet    Refill:  5      F/U  Return in about 2 months (around 11/21/2022).  I spent 21 minutes involved in the care of this patient preparing to see the patient by obtaining and reviewing her medical history (including labs, imaging tests and prior procedures), documenting clinical information in the electronic health record (EHR), counseling and coordinating care plans, writing and sending prescriptions, ordering tests or procedures and in direct communicating with the patient and medical staff discussing pertinent items from her history and physical exam.  Finis Bud, M.D. 09/21/2022 8:34 AM

## 2022-10-04 NOTE — Telephone Encounter (Signed)
Pt had video visit with DJE.

## 2022-10-11 NOTE — Telephone Encounter (Signed)
Pt left msg on triage asking if we had received husband's semen analysis?

## 2022-10-11 NOTE — Telephone Encounter (Signed)
Sent patient mychart message

## 2023-01-16 ENCOUNTER — Encounter: Payer: Self-pay | Admitting: Obstetrics and Gynecology

## 2023-01-16 ENCOUNTER — Telehealth (INDEPENDENT_AMBULATORY_CARE_PROVIDER_SITE_OTHER): Payer: Medicaid Other | Admitting: Obstetrics and Gynecology

## 2023-01-16 DIAGNOSIS — N469 Male infertility, unspecified: Secondary | ICD-10-CM

## 2023-01-16 DIAGNOSIS — E88819 Insulin resistance, unspecified: Secondary | ICD-10-CM

## 2023-01-16 MED ORDER — GLYBURIDE 5 MG PO TABS: 5.0000 mg | ORAL_TABLET | Freq: Two times a day (BID) | ORAL | 0 refills | Status: DC

## 2023-01-16 NOTE — Progress Notes (Signed)
Virtual Visit via Video Note  I connected with Cindy Gibson on 01/16/23 at  7:35 AM EDT by video and verified that I was speaking with the correct person using two identifiers.    [name] is a 32 y.o. G2P0010 who LMP was No LMP recorded. I discussed the limitations, risks, security and privacy concerns of performing an evaluation and management service by video and the availability of in person appointments. I also discussed with the patient that there may be a patient responsible charge related to this service. The patient expressed understanding and agreed to proceed.  Location of patient:  Home  Patient gave explicit verbal consent for video visit:  YES  Location of provider:  [facility] office  Persons other than physician and patient involved in provider conference:  None   Subjective:   History of Present Illness:    She has been followed for problems with fertility.  She is having regular cycles every 28 to 29 days.  She appears ovulatory.  She has been taking metformin for insulin resistance however she says it makes her sick both times of day that she takes it.  She does not want to continue to take it. She also states that she has been to the emergency department for lower extremity swelling. Her partners semen analysis was abnormal and he is scheduling another one as a follow-up.   Hx: The following portions of the patient's history were reviewed and updated as appropriate:             She  has a past medical history of GERD (gastroesophageal reflux disease) and Medical history non-contributory. She does not have any pertinent problems on file. She  has a past surgical history that includes Dilation and curettage of uterus and Cesarean section (N/A, 01/17/2018). Her family history includes CAD in her maternal grandfather; Diabetes in her maternal grandmother and mother; Rheum arthritis in her maternal grandmother and mother. She  reports that she has never  smoked. She has never used smokeless tobacco. She reports that she does not drink alcohol and does not use drugs. She has a current medication list which includes the following prescription(s): metformin and multivitamin-prenatal. She has No Known Allergies.       Review of Systems:  Review of Systems  Constitutional: Denied constitutional symptoms, night sweats, recent illness, fatigue, fever, insomnia and weight loss.  Eyes: Denied eye symptoms, eye pain, photophobia, vision change and visual disturbance.  Ears/Nose/Throat/Neck: Denied ear, nose, throat or neck symptoms, hearing loss, nasal discharge, sinus congestion and sore throat.  Cardiovascular: Denied cardiovascular symptoms, arrhythmia, chest pain/pressure, edema, exercise intolerance, orthopnea and palpitations.  Respiratory: Denied pulmonary symptoms, asthma, pleuritic pain, productive sputum, cough, dyspnea and wheezing.  Gastrointestinal: Denied, gastro-esophageal reflux, melena, nausea and vomiting.  Genitourinary: Denied genitourinary symptoms including symptomatic vaginal discharge, pelvic relaxation issues, and urinary complaints.  Musculoskeletal: Denied musculoskeletal symptoms, stiffness, swelling, muscle weakness and myalgia.  Dermatologic: Denied dermatology symptoms, rash and scar.  Neurologic: Denied neurology symptoms, dizziness, headache, neck pain and syncope.  Psychiatric: Denied psychiatric symptoms, anxiety and depression.  Endocrine: Denied endocrine symptoms including hot flashes and night sweats.   Meds:   Current Outpatient Medications on File Prior to Visit  Medication Sig Dispense Refill   metFORMIN (GLUCOPHAGE) 500 MG tablet Take one tablet by mouth daily for one week. Then increase to one tablet twice a day for one week.  Then two tablets twice a day. 60 tablet 5   Prenatal  Vit-Fe Fumarate-FA (MULTIVITAMIN-PRENATAL) 27-0.8 MG TABS tablet Take 1 tablet by mouth daily at 12 noon.     No current  facility-administered medications on file prior to visit.    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  She seems to be ovulatory with regular cycles.  She is having some issues with her metformin and would like to change to a different medication or stop completely.  I think it best if we changed to something else.  Plan:            1.  Partner to have a follow-up semen analysis  2.  Discontinue metformin and begin glyburide  3.  She will inform us once his semen analysis is complete.  4.  She has an appointment with her PCP in September for follow-up of her sugars and lower extremity swelling. Orders No orders of the defined types were placed in this encounter.   No orders of the defined types were placed in this encounter.     F/U  Return in about 3 months (around 04/18/2023). I spent 24 minutes involved in the care of this patient preparing to see the patient by obtaining and reviewing her medical history (including labs, imaging tests and prior procedures), documenting clinical information in the electronic health record (EHR), counseling and coordinating care plans, writing and sending prescriptions, ordering tests or procedures and in direct communicating with the patient and medical staff discussing pertinent items from her history and physical exam.   Elonda Husky, M.D. 01/16/2023 7:50 AM

## 2023-04-24 ENCOUNTER — Other Ambulatory Visit: Payer: Self-pay | Admitting: Obstetrics and Gynecology

## 2023-04-24 DIAGNOSIS — E88819 Insulin resistance, unspecified: Secondary | ICD-10-CM

## 2023-09-13 ENCOUNTER — Ambulatory Visit: Payer: Medicaid Other | Admitting: Obstetrics and Gynecology

## 2023-09-13 ENCOUNTER — Other Ambulatory Visit (HOSPITAL_COMMUNITY)
Admission: RE | Admit: 2023-09-13 | Discharge: 2023-09-13 | Disposition: A | Discharge status: Home / Self Care | Source: Ambulatory Visit | Attending: Obstetrics and Gynecology | Admitting: Obstetrics and Gynecology

## 2023-09-13 ENCOUNTER — Encounter: Payer: Self-pay | Admitting: Obstetrics and Gynecology

## 2023-09-13 DIAGNOSIS — Z01419 Encounter for gynecological examination (general) (routine) without abnormal findings: Secondary | ICD-10-CM | POA: Diagnosis present

## 2023-09-13 DIAGNOSIS — Z113 Encounter for screening for infections with a predominantly sexual mode of transmission: Secondary | ICD-10-CM

## 2023-09-13 DIAGNOSIS — N946 Dysmenorrhea, unspecified: Secondary | ICD-10-CM

## 2023-09-13 MED ORDER — MEFENAMIC ACID 250 MG PO CAPS: 250.0000 mg | ORAL_CAPSULE | Freq: Four times a day (QID) | ORAL | 1 refills | Status: AC

## 2023-09-13 NOTE — Progress Notes (Signed)
 Patients presents for annual exam today. She states cycles are regular, currently not using anything for birth control as she is wanting to become pregnant. Up to date on pap smear. Requesting STD cultures today. She states no other questions or concerns at this time.

## 2023-09-13 NOTE — Progress Notes (Signed)
 HPI:      [name] is a 33 y.o. G3P0020 who LMP was No LMP recorded.  Subjective:   She presents today for her annual examination.  She says she is doing well.  She continues to have normal monthly cycles which she says are ovulatory.  She has a history of insulin resistance and is now on  Wegovy.  She reports her cycles as very painful.  She has tried multiple over-the-counter medications without success. She would like to become pregnant and has been trying for a year.  Her partner has been shown to have a low sperm count on semen analysis. She is requesting routine STD testing-she reports no symptoms.    Hx: The following portions of the patient's history were reviewed and updated as appropriate:             She  has a past medical history of GERD (gastroesophageal reflux disease) and Medical history non-contributory. She does not have any pertinent problems on file. She  has a past surgical history that includes Dilation and curettage of uterus and Cesarean section (N/A, 01/17/2018). Her family history includes CAD in her maternal grandfather; Diabetes in her maternal grandmother and mother; Rheum arthritis in her maternal grandmother and mother. She  reports that she has never smoked. She has never used smokeless tobacco. She reports that she does not drink alcohol and does not use drugs. She has a current medication list which includes the following prescription(s): mefenamic acid and wegovy. She has no known allergies.       Review of Systems:  Review of Systems  Constitutional: Denied constitutional symptoms, night sweats, recent illness, fatigue, fever, insomnia and weight loss.  Eyes: Denied eye symptoms, eye pain, photophobia, vision change and visual disturbance.  Ears/Nose/Throat/Neck: Denied ear, nose, throat or neck symptoms, hearing loss, nasal discharge, sinus congestion and sore throat.  Cardiovascular: Denied cardiovascular symptoms, arrhythmia, chest  pain/pressure, edema, exercise intolerance, orthopnea and palpitations.  Respiratory: Denied pulmonary symptoms, asthma, pleuritic pain, productive sputum, cough, dyspnea and wheezing.  Gastrointestinal: Denied, gastro-esophageal reflux, melena, nausea and vomiting.  Genitourinary: See HPI for additional information.  Musculoskeletal: Denied musculoskeletal symptoms, stiffness, swelling, muscle weakness and myalgia.  Dermatologic: Denied dermatology symptoms, rash and scar.  Neurologic: Denied neurology symptoms, dizziness, headache, neck pain and syncope.  Psychiatric: Denied psychiatric symptoms, anxiety and depression.  Endocrine: Denied endocrine symptoms including hot flashes and night sweats.   Meds:   Current Outpatient Medications on File Prior to Visit  Medication Sig Dispense Refill   WEGOVY 0.25 MG/0.5ML SOAJ Inject 0.5 mg into the skin once a week.     No current facility-administered medications on file prior to visit.     Objective:     There were no vitals filed for this visit.  There were no vitals filed for this visit.            Physical examination General NAD, Conversant  HEENT Atraumatic; Op clear with mmm.  Normo-cephalic.  Anicteric sclerae  Thyroid/Neck Smooth without nodularity or enlargement. Normal ROM.  Neck Supple.  Skin No rashes, lesions or ulceration. Normal palpated skin turgor. No nodularity.  Breasts: No masses or discharge.  Symmetric.  No axillary adenopathy.  Lungs: Clear to auscultation.No rales or wheezes. Normal Respiratory effort, no retractions.  Heart: NSR.  No murmurs or rubs appreciated. No peripheral edema  Abdomen: Soft.  Non-tender.  No masses.  No HSM. No hernia  Extremities: Moves all appropriately.  Normal ROM  for age. No lymphadenopathy.  Neuro: Oriented to PPT.  Normal mood. Normal affect.     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 Well woman exam with routine gynecological exam   2. Screening for STD (sexually transmitted disease)   3. Dysmenorrhea     Likely has female factor infertility.  She would like to try something different for her dysmenorrhea.  Does not want cycle control methods because they also are birth control methods.   Plan:            1.  Basic Screening Recommendations The basic screening recommendations for asymptomatic women were discussed with the patient during her visit.  The age-appropriate recommendations were discussed with her and the rational for the tests reviewed.  When I am informed by the patient that another primary care physician has previously obtained the age-appropriate tests and they are up-to-date, only outstanding tests are ordered and referrals given as necessary.  Abnormal results of tests will be discussed with her when all of her results are completed.  Routine preventative health maintenance measures emphasized: Exercise/Diet/Weight control, Tobacco Warnings, Alcohol/Substance use risks and Stress Management 2.  Offered to refer to infertility but she would like to have her partner go to urology first for low sperm count. 3.  Ponstel for dysmenorrhea  Orders No orders of the defined types were placed in this encounter.    Meds ordered this encounter  Medications   Mefenamic Acid 250 MG CAPS    Sig: Take 1 capsule (250 mg total) by mouth every 6 (six) hours. For the first 2 days of the menstrual cycle.    Dispense:  30 capsule    Refill:  1          F/U  Return in about 1 year (around 09/12/2024) for Annual Physical.  Elonda Husky, M.D. 09/13/2023 2:04 PM

## 2023-09-17 LAB — CERVICOVAGINAL ANCILLARY ONLY
Bacterial Vaginitis (gardnerella): NEGATIVE
Candida Glabrata: NEGATIVE
Candida Vaginitis: NEGATIVE
Chlamydia: NEGATIVE
Comment: NEGATIVE
Comment: NEGATIVE
Comment: NEGATIVE
Comment: NEGATIVE
Comment: NEGATIVE
Comment: NORMAL
Neisseria Gonorrhea: NEGATIVE
Trichomonas: NEGATIVE

## 2023-09-27 ENCOUNTER — Telehealth: Payer: Self-pay

## 2023-09-27 NOTE — Telephone Encounter (Signed)
 TRIAGE VOICEMAIL: Patient calling for prior authorization for Mefenamic Acid 250 MG CAPS  . She tried to pick up the rx last night but was advised by pharmacy they are waiting on prior authorization approval.

## 2023-09-28 NOTE — Telephone Encounter (Signed)
 Patient aware prior authorization sent today. It can take 24-28 hours. Advised to reach out to pharmacy tomorrow to have them reprocess to see if it will go thru. If not, contact us back on Monday.

## 2023-10-01 NOTE — Telephone Encounter (Signed)
 Marland Kitchen

## 2023-11-12 NOTE — Telephone Encounter (Signed)
 Cindy Gibson

## 2024-02-27 ENCOUNTER — Telehealth: Payer: Self-pay

## 2024-02-27 NOTE — Telephone Encounter (Signed)
 Received call on triage line from patient who wonders if Dr. Janit will refill her Metformin  for her.  She says her primary care doctor retired and she ran out of refills at the end of March 2025 and she is now experiencing swelling she feels is from not using the Metformin .  She says Dr. Janit has previously prescribed this.  Reviewed chart and do not see that Dr. Janit has ever given her Metformin .  Advised she would need an appointment to discuss this with him, but recommended she call her primary care doctor's office and let them know the situation.  They should have a plan for handling prescriptions for retired providers and since she has a primary care office they should be managing her primary care medications.  Patient verbalized understanding and agreement.
